# Patient Record
Sex: Male | Born: 1970 | Race: White | Hispanic: No | Marital: Married | Smoking: Current some day smoker
Health system: Southern US, Community
[De-identification: ages and names within clinical notes are randomized; demographics above are authoritative.]

## PROBLEM LIST (undated history)

## (undated) DIAGNOSIS — E119 Type 2 diabetes mellitus without complications: Secondary | ICD-10-CM

## (undated) DIAGNOSIS — I10 Essential (primary) hypertension: Secondary | ICD-10-CM

## (undated) HISTORY — PX: TOTAL HIP ARTHROPLASTY: SHX124

## (undated) HISTORY — PX: BACK SURGERY: SHX140

---

## 2002-08-17 ENCOUNTER — Emergency Department (HOSPITAL_COMMUNITY): Admission: EM | Admit: 2002-08-17 | Discharge: 2002-08-17 | Payer: Self-pay | Admitting: Emergency Medicine

## 2003-10-14 ENCOUNTER — Ambulatory Visit: Payer: Self-pay | Admitting: Gastroenterology

## 2009-01-02 ENCOUNTER — Ambulatory Visit: Payer: Self-pay | Admitting: Unknown Physician Specialty

## 2016-09-14 ENCOUNTER — Emergency Department
Admission: EM | Admit: 2016-09-14 | Discharge: 2016-09-14 | Disposition: A | Discharge status: Home / Self Care | Payer: BLUE CROSS/BLUE SHIELD | Attending: Emergency Medicine | Admitting: Emergency Medicine

## 2016-09-14 ENCOUNTER — Encounter: Payer: Self-pay | Admitting: Emergency Medicine

## 2016-09-14 ENCOUNTER — Emergency Department: Payer: BLUE CROSS/BLUE SHIELD

## 2016-09-14 DIAGNOSIS — M79644 Pain in right finger(s): Secondary | ICD-10-CM | POA: Diagnosis present

## 2016-09-14 DIAGNOSIS — L03011 Cellulitis of right finger: Secondary | ICD-10-CM | POA: Diagnosis not present

## 2016-09-14 DIAGNOSIS — I891 Lymphangitis: Secondary | ICD-10-CM

## 2016-09-14 LAB — COMPREHENSIVE METABOLIC PANEL
ALK PHOS: 86 U/L (ref 38–126)
ALT: 48 U/L (ref 17–63)
AST: 49 U/L — AB (ref 15–41)
Albumin: 4.2 g/dL (ref 3.5–5.0)
Anion gap: 10 (ref 5–15)
BILIRUBIN TOTAL: 0.9 mg/dL (ref 0.3–1.2)
BUN: 9 mg/dL (ref 6–20)
CALCIUM: 9.1 mg/dL (ref 8.9–10.3)
CO2: 24 mmol/L (ref 22–32)
Chloride: 102 mmol/L (ref 101–111)
Creatinine, Ser: 0.92 mg/dL (ref 0.61–1.24)
GFR calc Af Amer: >60 mL/min (ref >60)
Glucose, Bld: 170 mg/dL — ABNORMAL HIGH (ref 65–99)
Potassium: 3.6 mmol/L (ref 3.5–5.1)
Sodium: 136 mmol/L (ref 135–145)
TOTAL PROTEIN: 7.7 g/dL (ref 6.5–8.1)

## 2016-09-14 LAB — CBC WITH DIFFERENTIAL/PLATELET
BASOS ABS: 0.1 10*3/uL (ref 0–0.1)
BASOS PCT: 1 %
Eosinophils Absolute: 0.2 10*3/uL (ref 0–0.7)
Eosinophils Relative: 2 %
HEMATOCRIT: 41.7 % (ref 40.0–52.0)
HEMOGLOBIN: 14.6 g/dL (ref 13.0–18.0)
LYMPHS PCT: 17 %
Lymphs Abs: 2.4 10*3/uL (ref 1.0–3.6)
MCH: 33.2 pg (ref 26.0–34.0)
MCHC: 35.1 g/dL (ref 32.0–36.0)
MCV: 94.7 fL (ref 80.0–100.0)
MONOS PCT: 10 %
Monocytes Absolute: 1.3 10*3/uL — ABNORMAL HIGH (ref 0.2–1.0)
NEUTROS ABS: 9.9 10*3/uL — AB (ref 1.4–6.5)
NEUTROS PCT: 70 %
Platelets: 286 10*3/uL (ref 150–440)
RBC: 4.4 MIL/uL (ref 4.40–5.90)
RDW: 13.2 % (ref 11.5–14.5)
WBC: 13.8 10*3/uL — ABNORMAL HIGH (ref 3.8–10.6)

## 2016-09-14 MED ORDER — MUPIROCIN 2 % EX OINT: TOPICAL_OINTMENT | CUTANEOUS | 0 refills | Status: AC

## 2016-09-14 MED ORDER — HYDROMORPHONE HCL 1 MG/ML IJ SOLN
INTRAMUSCULAR | Status: AC
Start: 2016-09-14 — End: 2016-09-14
  Administered 2016-09-14: 1 mg via INTRAVENOUS
  Filled 2016-09-14: qty 1

## 2016-09-14 MED ORDER — HYDROMORPHONE HCL 1 MG/ML IJ SOLN
1.0000 mg | Freq: Once | INTRAMUSCULAR | Status: AC
  Administered 2016-09-14: 1 mg via INTRAVENOUS

## 2016-09-14 MED ORDER — ONDANSETRON HCL 4 MG/2ML IJ SOLN
INTRAMUSCULAR | Status: AC
  Administered 2016-09-14: 4 mg via INTRAVENOUS
  Filled 2016-09-14: qty 2

## 2016-09-14 MED ORDER — MORPHINE SULFATE (PF) 4 MG/ML IV SOLN
INTRAVENOUS | Status: AC
Start: 2016-09-14 — End: 2016-09-14
  Administered 2016-09-14: 4 mg via INTRAVENOUS
  Filled 2016-09-14: qty 1

## 2016-09-14 MED ORDER — MORPHINE SULFATE (PF) 4 MG/ML IV SOLN
4.0000 mg | Freq: Once | INTRAVENOUS | Status: AC
  Administered 2016-09-14: 4 mg via INTRAVENOUS

## 2016-09-14 MED ORDER — OXYCODONE-ACETAMINOPHEN 7.5-325 MG PO TABS: 1.0000 | ORAL_TABLET | ORAL | 0 refills | Status: AC | PRN

## 2016-09-14 MED ORDER — CLINDAMYCIN HCL 300 MG PO CAPS: 300.0000 mg | ORAL_CAPSULE | Freq: Three times a day (TID) | ORAL | 0 refills | Status: DC

## 2016-09-14 MED ORDER — ONDANSETRON HCL 4 MG/2ML IJ SOLN
4.0000 mg | Freq: Once | INTRAMUSCULAR | Status: AC
  Administered 2016-09-14: 4 mg via INTRAVENOUS

## 2016-09-14 MED ORDER — VANCOMYCIN HCL IN DEXTROSE 1-5 GM/200ML-% IV SOLN
1000.0000 mg | Freq: Once | INTRAVENOUS | Status: AC
  Administered 2016-09-14: 1000 mg via INTRAVENOUS
  Filled 2016-09-14: qty 200

## 2016-09-14 NOTE — ED Triage Notes (Signed)
Pt reports started with swelling and pain to right hand index finger yesterday. Pt denies injury. Swelling and redness noted.

## 2016-09-14 NOTE — ED Triage Notes (Signed)
Pt also reports that this am he had a syncopal episode as well.

## 2016-09-14 NOTE — ED Notes (Signed)
Patient states, his father reminded him that on Sunday he had stated he felt that he had a "sliver" in his finger he couldn't get out.

## 2016-09-14 NOTE — ED Notes (Addendum)
Patient presents to the ED with severe pain, swelling and discoloration of his forefinger on his right hand.  Patient reports pain and swelling began yesterday.  Patient states, "at 2am the pain was so bad I passed out."  Patient denies any trauma to his finger.  There is a black area of swelling, approximately the size of a dime on patient's forefinger.

## 2016-09-14 NOTE — ED Provider Notes (Addendum)
[facility] Emergency Department Provider Note       Time seen: ----------------------------------------- 8:05 AM on 09/14/2016 -----------------------------------------     I have reviewed the triage vital signs and the nursing notes.   HISTORY   Chief Complaint Finger Injury and Wound Infection    HPI Jesse Skinner is a 46 y.o. male who presents to the ED for pain and swelling to the right index finger since yesterday. Patient denies any known injury, works [employer] and does admit to biting his fingernails occasionally. Patient has had swelling and redness noted that extends up the right arm. He may have had a syncopal event due to the pain this morning he thinks.   History reviewed. No pertinent past medical history.  There are no active problems to display for this patient.   History reviewed. No pertinent surgical history.  Allergies Patient has no known allergies.  Social History Social History  Substance Use Topics  . Smoking status: Not on file  . Smokeless tobacco: Not on file  . Alcohol use Not on file    Review of Systems Constitutional: Negative for fever.positive for chills and sweats Cardiovascular: Negative for chest pain. Respiratory: Negative for shortness of breath. Gastrointestinal: Negative for abdominal pain, positive for nausea Musculoskeletal: positive for right index finger and hand pain Skin:positive for diaphoresis Neurological: Negative for headaches, focal weakness or numbness.  All systems negative/normal/unremarkable except as stated in the HPI  ____________________________________________   PHYSICAL EXAM:  VITAL SIGNS: ED Triage Vitals  Enc Vitals Group     BP 09/14/16 0734 (!) 176/111     Pulse Rate 09/14/16 0734 (!) 123     Resp 09/14/16 0734 20     Temp 09/14/16 0734 98.1 F (36.7 C)     Temp Source 09/14/16 0734 Oral     SpO2 09/14/16 0734 97 %     Weight 09/14/16 0730 210 lb (95.3  kg)     Height 09/14/16 0730  (1.727 m)     Head Circumference --      Peak Flow --      Pain Score 09/14/16 0729 9     Pain Loc --      Pain Edu? --      Excl. in GC? --     Constitutional: Alert and oriented. mild distress Eyes: Conjunctivae are normal. Normal extraocular movements. Cardiovascular: Normal rate, regular rhythm. No murmurs, rubs, or gallops. Respiratory: Normal respiratory effort without tachypnea nor retractions. Breath sounds are clear and equal bilaterally. No wheezes/rales/rhonchi. Gastrointestinal: Soft and nontender. Normal bowel sounds Musculoskeletal: tenderness along the right index finger particularly over the dorsal aspect. There is no fusiform swelling, there is some swelling over the distal fingertip but there is extensive swelling over the finger medially in anatomic position. Distally there is a bulla formation. No adenopathy is noted Neurologic:  Normal speech and language. No gross focal neurologic deficits are appreciated.  Skin:  erythema with blister formation over the right index finger medially and distally.there is a serpiginous erythema that extends from the right index finger  to the mid to upper arm. Psychiatric: Mood and affect are normal. Speech and behavior are normal.  ___________________________________________  ED COURSE:  Pertinent labs & imaging results that were available during my care of the patient were reviewed by me and considered in my medical decision making (see chart for details). Patient presents for fingertip infection with lymphangitis, we will assess with labs and imaging as indicated. We will  drain the bulla and send it for culture. He will receive IV vancomycin.   Marland Kitchen..Incision and Drainage Date/Time: 09/14/2016 9:46 AM Performed by: Emily FilbertWILLIAMS, Lawana Hartzell E Authorized by: Daryel NovemberWILLIAMS, Jordynn Perrier E   Consent:    Consent obtained:  Verbal Location:    Type:  Fluid collection   Location:  Upper extremity   Upper extremity  location:  Finger   Finger location:  R index finger Pre-procedure details:    Skin preparation:  Antiseptic wash and Betadine Anesthesia (see MAR for exact dosages):    Anesthesia method:  Local infiltration   Local anesthetic:  Lidocaine 1% WITH epi Procedure type:    Complexity:  Complex Procedure details:    Incision types:  Stab incision   Incision depth:  Subcutaneous   Scalpel blade:  11   Wound management:  Probed and deloculated   Drainage:  Bloody and purulent   Drainage amount:  Moderate   Wound treatment:  Wound left open   Packing materials:  None Post-procedure details:    Patient tolerance of procedure:  Tolerated well, no immediate complications Comments:     patient underwent incision and drainage of a paronychia on his right index finger and tolerated this well.   ____________________________________________   LABS (pertinent positives/negatives)  Labs Reviewed  CBC WITH DIFFERENTIAL/PLATELET - Abnormal; Notable for the following:       Result Value   WBC 13.8 (*)    Neutro Abs 9.9 (*)    Monocytes Absolute 1.3 (*)    All other components within normal limits  COMPREHENSIVE METABOLIC PANEL - Abnormal; Notable for the following:    Glucose, Bld 170 (*)    AST 49 (*)    All other components within normal limits  AEROBIC/ANAEROBIC CULTURE (SURGICAL/DEEP WOUND)    RADIOLOGY Images were viewed by me  right index finger x-ray IMPRESSION: No acute bony abnormality.  Focal soft tissue swelling at the distal second digit as above. ____________________________________________  FINAL ASSESSMENT AND PLAN  paronychia, cellulitis  Plan: Patient's labs and imaging were dictated above. Patient had presented for right index finger pain and swelling with some signs of lymphangitis. He did undergo incision and drainage successfully eating continues to have drainage from the right index finger. We did give IV vancomycin and he'll continue home with oral  clindamycin as well as Bactroban and pain medicine. I did offer admission for pain control and continued IV antibiotics but he has declined and would prefer to go home. He is advised to follow-up in 1-2 days for wound recheck.   Emily FilbertWilliams, Keola Heninger E, MD   Note: This note was generated in part or whole with voice recognition software. Voice recognition is usually quite accurate but there are transcription errors that can and very often do occur. I apologize for any typographical errors that were not detected and corrected.     Emily FilbertWilliams, Charrise Lardner E, MD 09/14/16 [id number]    Emily FilbertWilliams, Evadne Ose E, MD 09/14/16 1058

## 2016-09-19 LAB — AEROBIC/ANAEROBIC CULTURE W GRAM STAIN (SURGICAL/DEEP WOUND): Special Requests: NORMAL

## 2017-04-18 ENCOUNTER — Emergency Department: Payer: BLUE CROSS/BLUE SHIELD

## 2017-04-18 ENCOUNTER — Encounter: Payer: Self-pay | Admitting: Emergency Medicine

## 2017-04-18 ENCOUNTER — Inpatient Hospital Stay
Admission: EM | Admit: 2017-04-18 | Discharge: 2017-04-22 | DRG: 854 | Disposition: A | Discharge status: Home / Self Care | Payer: BLUE CROSS/BLUE SHIELD | Attending: Internal Medicine | Admitting: Internal Medicine

## 2017-04-18 ENCOUNTER — Other Ambulatory Visit: Payer: Self-pay

## 2017-04-18 DIAGNOSIS — Z7982 Long term (current) use of aspirin: Secondary | ICD-10-CM | POA: Diagnosis not present

## 2017-04-18 DIAGNOSIS — L03119 Cellulitis of unspecified part of limb: Secondary | ICD-10-CM | POA: Diagnosis present

## 2017-04-18 DIAGNOSIS — F1010 Alcohol abuse, uncomplicated: Secondary | ICD-10-CM | POA: Diagnosis present

## 2017-04-18 DIAGNOSIS — Z7984 Long term (current) use of oral hypoglycemic drugs: Secondary | ICD-10-CM

## 2017-04-18 DIAGNOSIS — E669 Obesity, unspecified: Secondary | ICD-10-CM | POA: Diagnosis present

## 2017-04-18 DIAGNOSIS — B9562 Methicillin resistant Staphylococcus aureus infection as the cause of diseases classified elsewhere: Secondary | ICD-10-CM | POA: Diagnosis present

## 2017-04-18 DIAGNOSIS — L02411 Cutaneous abscess of right axilla: Secondary | ICD-10-CM | POA: Diagnosis present

## 2017-04-18 DIAGNOSIS — L02413 Cutaneous abscess of right upper limb: Secondary | ICD-10-CM | POA: Diagnosis present

## 2017-04-18 DIAGNOSIS — E119 Type 2 diabetes mellitus without complications: Secondary | ICD-10-CM | POA: Diagnosis present

## 2017-04-18 DIAGNOSIS — L0390 Cellulitis, unspecified: Secondary | ICD-10-CM

## 2017-04-18 DIAGNOSIS — Z79899 Other long term (current) drug therapy: Secondary | ICD-10-CM

## 2017-04-18 DIAGNOSIS — R590 Localized enlarged lymph nodes: Secondary | ICD-10-CM

## 2017-04-18 DIAGNOSIS — F17200 Nicotine dependence, unspecified, uncomplicated: Secondary | ICD-10-CM | POA: Diagnosis present

## 2017-04-18 DIAGNOSIS — A419 Sepsis, unspecified organism: Principal | ICD-10-CM | POA: Diagnosis present

## 2017-04-18 DIAGNOSIS — L03113 Cellulitis of right upper limb: Secondary | ICD-10-CM

## 2017-04-18 DIAGNOSIS — Z96642 Presence of left artificial hip joint: Secondary | ICD-10-CM | POA: Diagnosis present

## 2017-04-18 DIAGNOSIS — Z6834 Body mass index (BMI) 34.0-34.9, adult: Secondary | ICD-10-CM

## 2017-04-18 DIAGNOSIS — L03111 Cellulitis of right axilla: Secondary | ICD-10-CM | POA: Diagnosis present

## 2017-04-18 DIAGNOSIS — I10 Essential (primary) hypertension: Secondary | ICD-10-CM | POA: Diagnosis present

## 2017-04-18 HISTORY — DX: Type 2 diabetes mellitus without complications: E11.9

## 2017-04-18 HISTORY — DX: Essential (primary) hypertension: I10

## 2017-04-18 LAB — COMPREHENSIVE METABOLIC PANEL
ALBUMIN: 3.7 g/dL (ref 3.5–5.0)
ALT: 28 U/L (ref 17–63)
ANION GAP: 10 (ref 5–15)
AST: 32 U/L (ref 15–41)
Alkaline Phosphatase: 88 U/L (ref 38–126)
BILIRUBIN TOTAL: 0.9 mg/dL (ref 0.3–1.2)
BUN: 8 mg/dL (ref 6–20)
CO2: 23 mmol/L (ref 22–32)
Calcium: 9.2 mg/dL (ref 8.9–10.3)
Chloride: 95 mmol/L — ABNORMAL LOW (ref 101–111)
Creatinine, Ser: 0.73 mg/dL (ref 0.61–1.24)
GFR calc Af Amer: >60 mL/min (ref >60)
GFR calc non Af Amer: >60 mL/min (ref >60)
GLUCOSE: 191 mg/dL — AB (ref 65–99)
POTASSIUM: 3.9 mmol/L (ref 3.5–5.1)
Sodium: 128 mmol/L — ABNORMAL LOW (ref 135–145)
TOTAL PROTEIN: 8.2 g/dL — AB (ref 6.5–8.1)

## 2017-04-18 LAB — CBC WITH DIFFERENTIAL/PLATELET
BASOS PCT: 0 %
Basophils Absolute: 0.1 10*3/uL (ref 0–0.1)
EOS ABS: 0.1 10*3/uL (ref 0–0.7)
Eosinophils Relative: 1 %
HCT: 42 % (ref 40.0–52.0)
HEMOGLOBIN: 14.6 g/dL (ref 13.0–18.0)
LYMPHS ABS: 2.5 10*3/uL (ref 1.0–3.6)
Lymphocytes Relative: 16 %
MCH: 33.2 pg (ref 26.0–34.0)
MCHC: 34.8 g/dL (ref 32.0–36.0)
MCV: 95.5 fL (ref 80.0–100.0)
Monocytes Absolute: 1.6 10*3/uL — ABNORMAL HIGH (ref 0.2–1.0)
Monocytes Relative: 10 %
NEUTROS PCT: 73 %
Neutro Abs: 11.6 10*3/uL — ABNORMAL HIGH (ref 1.4–6.5)
Platelets: 336 10*3/uL (ref 150–440)
RBC: 4.39 MIL/uL — AB (ref 4.40–5.90)
RDW: 11.9 % (ref 11.5–14.5)
WBC: 15.9 10*3/uL — AB (ref 3.8–10.6)

## 2017-04-18 LAB — URINALYSIS, COMPLETE (UACMP) WITH MICROSCOPIC
Bacteria, UA: NONE SEEN
Bilirubin Urine: NEGATIVE
Ketones, ur: NEGATIVE mg/dL
Leukocytes, UA: NEGATIVE
Nitrite: NEGATIVE
PH: 6 (ref 5.0–8.0)
PROTEIN: NEGATIVE mg/dL
SPECIFIC GRAVITY, URINE: 1.018 (ref 1.005–1.030)
Squamous Epithelial / LPF: NONE SEEN

## 2017-04-18 LAB — LACTIC ACID, PLASMA
LACTIC ACID, VENOUS: 2.1 mmol/L — AB (ref 0.5–1.9)
Lactic Acid, Venous: 0.7 mmol/L (ref 0.5–1.9)

## 2017-04-18 LAB — GLUCOSE, CAPILLARY
Glucose-Capillary: 116 mg/dL — ABNORMAL HIGH (ref 65–99)
Glucose-Capillary: 173 mg/dL — ABNORMAL HIGH (ref 65–99)

## 2017-04-18 MED ORDER — ACETAMINOPHEN 650 MG RE SUPP: 650.0000 mg | Freq: Four times a day (QID) | RECTAL | Status: DC | PRN

## 2017-04-18 MED ORDER — ONDANSETRON HCL 4 MG/2ML IJ SOLN: 4.0000 mg | Freq: Four times a day (QID) | INTRAMUSCULAR | Status: DC | PRN

## 2017-04-18 MED ORDER — VITAMIN B-1 100 MG PO TABS
100.0000 mg | ORAL_TABLET | Freq: Every day | ORAL | Status: DC
  Administered 2017-04-18 – 2017-04-22 (×5): 100 mg via ORAL
  Filled 2017-04-18 (×5): qty 1

## 2017-04-18 MED ORDER — VANCOMYCIN HCL IN DEXTROSE 1-5 GM/200ML-% IV SOLN
1000.0000 mg | Freq: Once | INTRAVENOUS | Status: AC
  Administered 2017-04-18: 1000 mg via INTRAVENOUS
  Filled 2017-04-18: qty 200

## 2017-04-18 MED ORDER — ENOXAPARIN SODIUM 40 MG/0.4ML ~~LOC~~ SOLN
40.0000 mg | SUBCUTANEOUS | Status: DC
  Administered 2017-04-18 – 2017-04-21 (×4): 40 mg via SUBCUTANEOUS
  Filled 2017-04-18 (×4): qty 0.4

## 2017-04-18 MED ORDER — HYDROMORPHONE HCL 1 MG/ML IJ SOLN
1.0000 mg | Freq: Once | INTRAMUSCULAR | Status: AC
  Administered 2017-04-18: 1 mg via INTRAVENOUS
  Filled 2017-04-18: qty 1

## 2017-04-18 MED ORDER — METOPROLOL SUCCINATE ER 50 MG PO TB24
50.0000 mg | ORAL_TABLET | Freq: Every day | ORAL | Status: DC
  Administered 2017-04-18 – 2017-04-22 (×5): 50 mg via ORAL
  Filled 2017-04-18 (×5): qty 1

## 2017-04-18 MED ORDER — ACETAMINOPHEN 325 MG PO TABS: 650.0000 mg | ORAL_TABLET | Freq: Four times a day (QID) | ORAL | Status: DC | PRN

## 2017-04-18 MED ORDER — HYDROCODONE-ACETAMINOPHEN 5-325 MG PO TABS
ORAL_TABLET | ORAL | Status: AC
  Filled 2017-04-18: qty 2

## 2017-04-18 MED ORDER — GABAPENTIN 400 MG PO CAPS
800.0000 mg | ORAL_CAPSULE | Freq: Three times a day (TID) | ORAL | Status: DC
  Administered 2017-04-18 – 2017-04-22 (×11): 800 mg via ORAL
  Filled 2017-04-18 (×11): qty 2

## 2017-04-18 MED ORDER — BISACODYL 10 MG RE SUPP
10.0000 mg | Freq: Every day | RECTAL | Status: DC | PRN
  Administered 2017-04-20: 10 mg via RECTAL
  Filled 2017-04-18 (×2): qty 1

## 2017-04-18 MED ORDER — HYDROCODONE-ACETAMINOPHEN 5-325 MG PO TABS
1.0000 | ORAL_TABLET | ORAL | Status: DC | PRN
  Administered 2017-04-18 – 2017-04-21 (×12): 2 via ORAL
  Administered 2017-04-21: 1 via ORAL
  Administered 2017-04-21 – 2017-04-22 (×4): 2 via ORAL
  Filled 2017-04-18: qty 2
  Filled 2017-04-18: qty 1
  Filled 2017-04-18 (×14): qty 2

## 2017-04-18 MED ORDER — INSULIN ASPART 100 UNIT/ML ~~LOC~~ SOLN: 0.0000 [IU] | Freq: Every day | SUBCUTANEOUS | Status: DC

## 2017-04-18 MED ORDER — SODIUM CHLORIDE 0.9 % IV BOLUS (SEPSIS)
1000.0000 mL | Freq: Once | INTRAVENOUS | Status: AC
  Administered 2017-04-18: 1000 mL via INTRAVENOUS

## 2017-04-18 MED ORDER — ONDANSETRON HCL 4 MG PO TABS: 4.0000 mg | ORAL_TABLET | Freq: Four times a day (QID) | ORAL | Status: DC | PRN

## 2017-04-18 MED ORDER — POTASSIUM CHLORIDE IN NACL 20-0.9 MEQ/L-% IV SOLN
INTRAVENOUS | Status: DC
  Administered 2017-04-18 – 2017-04-19 (×2): via INTRAVENOUS
  Filled 2017-04-18 (×5): qty 1000

## 2017-04-18 MED ORDER — SODIUM CHLORIDE 0.9 % IV BOLUS
1000.0000 mL | Freq: Once | INTRAVENOUS | Status: AC
Start: 2017-04-18 — End: 2017-04-18
  Administered 2017-04-18: 1000 mL via INTRAVENOUS

## 2017-04-18 MED ORDER — MORPHINE SULFATE (PF) 2 MG/ML IV SOLN
2.0000 mg | INTRAVENOUS | Status: DC | PRN
  Administered 2017-04-18 – 2017-04-22 (×19): 2 mg via INTRAVENOUS
  Filled 2017-04-18 (×19): qty 1

## 2017-04-18 MED ORDER — PANTOPRAZOLE SODIUM 40 MG PO TBEC
40.0000 mg | DELAYED_RELEASE_TABLET | Freq: Every day | ORAL | Status: DC
  Administered 2017-04-19 – 2017-04-22 (×4): 40 mg via ORAL
  Filled 2017-04-18 (×4): qty 1

## 2017-04-18 MED ORDER — FOLIC ACID 1 MG PO TABS
1.0000 mg | ORAL_TABLET | Freq: Every day | ORAL | Status: DC
  Administered 2017-04-18 – 2017-04-22 (×5): 1 mg via ORAL
  Filled 2017-04-18 (×5): qty 1

## 2017-04-18 MED ORDER — SODIUM CHLORIDE 0.9 % IV SOLN
1250.0000 mg | Freq: Three times a day (TID) | INTRAVENOUS | Status: DC
  Administered 2017-04-18 – 2017-04-22 (×10): 1250 mg via INTRAVENOUS
  Filled 2017-04-18 (×15): qty 1250

## 2017-04-18 MED ORDER — ASPIRIN EC 81 MG PO TBEC
81.0000 mg | DELAYED_RELEASE_TABLET | Freq: Every day | ORAL | Status: DC
  Administered 2017-04-19 – 2017-04-22 (×4): 81 mg via ORAL
  Filled 2017-04-18 (×4): qty 1

## 2017-04-18 MED ORDER — INSULIN ASPART 100 UNIT/ML ~~LOC~~ SOLN
0.0000 [IU] | Freq: Three times a day (TID) | SUBCUTANEOUS | Status: DC
  Administered 2017-04-19: 2 [IU] via SUBCUTANEOUS
  Administered 2017-04-19: 1 [IU] via SUBCUTANEOUS
  Administered 2017-04-19: 2 [IU] via SUBCUTANEOUS
  Administered 2017-04-20: 1 [IU] via SUBCUTANEOUS
  Administered 2017-04-21 (×2): 2 [IU] via SUBCUTANEOUS
  Administered 2017-04-21: 1 [IU] via SUBCUTANEOUS
  Administered 2017-04-22: 2 [IU] via SUBCUTANEOUS
  Filled 2017-04-18 (×8): qty 1

## 2017-04-18 MED ORDER — PIPERACILLIN-TAZOBACTAM 3.375 G IVPB 30 MIN
3.3750 g | Freq: Once | INTRAVENOUS | Status: AC
  Administered 2017-04-18: 3.375 g via INTRAVENOUS
  Filled 2017-04-18: qty 50

## 2017-04-18 MED ORDER — POLYETHYLENE GLYCOL 3350 17 G PO PACK
17.0000 g | PACK | Freq: Every day | ORAL | Status: DC | PRN
  Administered 2017-04-19 – 2017-04-21 (×3): 17 g via ORAL
  Filled 2017-04-18 (×3): qty 1

## 2017-04-18 MED ORDER — LORAZEPAM 1 MG PO TABS: 1.0000 mg | ORAL_TABLET | Freq: Four times a day (QID) | ORAL | Status: AC | PRN

## 2017-04-18 MED ORDER — SODIUM CHLORIDE 0.9 % IV BOLUS
500.0000 mL | Freq: Once | INTRAVENOUS | Status: AC
  Administered 2017-04-18: 500 mL via INTRAVENOUS

## 2017-04-18 MED ORDER — LORAZEPAM 2 MG/ML IJ SOLN: 1.0000 mg | Freq: Four times a day (QID) | INTRAMUSCULAR | Status: AC | PRN

## 2017-04-18 MED ORDER — SIMVASTATIN 40 MG PO TABS
40.0000 mg | ORAL_TABLET | Freq: Every day | ORAL | Status: DC
  Administered 2017-04-19 – 2017-04-21 (×3): 40 mg via ORAL
  Filled 2017-04-18 (×5): qty 1

## 2017-04-18 MED ORDER — SODIUM CHLORIDE 0.9 % IV SOLN
3.0000 g | Freq: Four times a day (QID) | INTRAVENOUS | Status: DC
  Administered 2017-04-18 – 2017-04-20 (×6): 3 g via INTRAVENOUS
  Filled 2017-04-18 (×8): qty 3

## 2017-04-18 MED ORDER — THIAMINE HCL 100 MG/ML IJ SOLN: 100.0000 mg | Freq: Every day | INTRAMUSCULAR | Status: DC

## 2017-04-18 MED ORDER — ADULT MULTIVITAMIN W/MINERALS CH
1.0000 | ORAL_TABLET | Freq: Every day | ORAL | Status: DC
  Administered 2017-04-18 – 2017-04-22 (×5): 1 via ORAL
  Filled 2017-04-18 (×5): qty 1

## 2017-04-18 MED ORDER — LISINOPRIL 20 MG PO TABS
20.0000 mg | ORAL_TABLET | Freq: Every day | ORAL | Status: DC
  Administered 2017-04-18 – 2017-04-22 (×5): 20 mg via ORAL
  Filled 2017-04-18 (×5): qty 1

## 2017-04-18 NOTE — Progress Notes (Signed)
CODE SEPSIS - PHARMACY COMMUNICATION  **Broad Spectrum Antibiotics should be administered within 1 hour of Sepsis diagnosis**  Time Code Sepsis Called/Page Received: 16:03  Antibiotics Ordered: vanc/Zosyn  Time of 1st antibiotic administration: 16:28  Additional action taken by pharmacy:   If necessary, Name of Provider/Nurse Contacted:     Carola FrostNathan A Briane Birden ,PharmD Clinical Pharmacist  04/18/2017  4:03 PM

## 2017-04-18 NOTE — ED Provider Notes (Addendum)
[facility] Emergency Department Provider Note  ____________________________________________   I have reviewed the triage vital signs and the nursing notes. Where available I have reviewed prior notes and, if possible and indicated, outside hospital notes.    HISTORY  Chief Complaint Abscess    HPI Jesse Skinner is a 47 y.o. male spontaneously draining abscess in his right axilla since Wednesday.  Patient states that his sugars have been somewhat up, he has been having fevers at home he feels unwell.  Subjective fevers has not taken his temperature.  He denies IV drug abuse.  He does admit to drinking 4-5 beers every day.  Does not admit to history of withdrawal.  Patient states that there is been pus draining out of the area and is not markedly indurated but now that there is swelling going down his arm he needed to be checked out.  Symptoms since last Wednesday, nothing makes it better nothing makes it worse no other alleviating or aggravating factors.  No Prior treatment it is very painful.   Past Medical History:  Diagnosis Date  . Diabetes mellitus without complication (HCC)   . Hypertension     There are no active problems to display for this patient.   Past Surgical History:  Procedure Laterality Date  . BACK SURGERY    . TOTAL HIP ARTHROPLASTY Left     Prior to Admission medications   Medication Sig Start Date End Date Taking? Authorizing Provider  amoxicillin (AMOXIL) 500 MG capsule Take 500 mg by mouth.   Yes [provider]  aspirin EC 81 MG tablet Take 81 mg by mouth daily.   Yes [provider]  gabapentin (NEURONTIN) 400 MG capsule Take 800 mg by mouth 3 (three) times daily.   Yes [provider]  glyBURIDE-metformin (GLUCOVANCE) 2.5-500 MG tablet Take 2 tablets by mouth 2 (two) times daily with a meal.  09/06/16   [provider]  lisinopril-hydrochlorothiazide (PRINZIDE,ZESTORETIC) 20-12.5 MG tablet  Take 1 tablet by mouth daily.    [provider]  meloxicam (MOBIC) 7.5 MG tablet Take 7.5 mg by mouth daily as needed for pain.    [provider]  metoprolol succinate (TOPROL-XL) 50 MG 24 hr tablet Take 50 mg by mouth daily. 09/06/16   [provider]  Multiple Vitamins-Minerals (CENTRUM ADULTS PO) Take 1 tablet by mouth daily.    [provider]  mupirocin ointment (BACTROBAN) 2 % Apply to affected area 3 times daily 09/14/16 09/14/17  Emily Filbert, MD  omeprazole (PRILOSEC) 40 MG capsule Take 40 mg by mouth daily. 09/06/16   [provider]  oxyCODONE-acetaminophen (PERCOCET) 7.5-325 MG tablet Take 1 tablet by mouth every 4 (four) hours as needed for severe pain. Patient not taking: Reported on 04/18/2017 09/14/16 09/14/17  Emily Filbert, MD  simvastatin (ZOCOR) 40 MG tablet Take 40 mg by mouth daily. 09/06/16   [provider]    Allergies Patient has no known allergies.  No family history on file.  Social History Social History   Tobacco Use  . Smoking status: Current Some Day Smoker  . Smokeless tobacco: Never Used  Substance Use Topics  . Alcohol use: Yes  . Drug use: Not on file    Review of Systems Constitutional: + fever Eyes: No visual changes. ENT: No sore throat. No stiff neck no neck pain Cardiovascular: Denies chest pain. Respiratory: Denies shortness of breath. Gastrointestinal:   no vomiting.  No diarrhea.  No constipation.  Genitourinary: Negative for dysuria. Musculoskeletal: Negative lower extremity swelling Skin: + for rash. Neurological: Negative for severe headaches, focal weakness or numbness.   ____________________________________________   PHYSICAL EXAM:  VITAL SIGNS: ED Triage Vitals  Enc Vitals Group     BP 04/18/17 1441 (!) 124/95     Pulse Rate 04/18/17 1441 (!) 133     Resp 04/18/17 1441 18     Temp 04/18/17 1441 99 F (37.2 C)     Temp Source 04/18/17 1441 Oral     SpO2  04/18/17 1441 98 %     Weight 04/18/17 1442 215 lb (97.5 kg)     Height 04/18/17 1442 5\' 7"  (1.702 m)     Head Circumference --      Peak Flow --      Pain Score 04/18/17 1442 10     Pain Loc --      Pain Edu? --      Excl. in GC? --     Constitutional: Alert and oriented. Well appearing and in no acute distress.  There is uncomfortable but nontoxic Eyes: Conjunctivae are normal Head: Atraumatic HEENT: No congestion/rhinnorhea. Mucous membranes are moist.  Oropharynx non-erythematous Neck:   Nontender with no meningismus, no masses, no stridor Cardiovascular: Normal rate, regular rhythm. Grossly normal heart sounds.  Good peripheral circulation. Respiratory: Normal respiratory effort.  No retractions. Lungs CTAB. Abdominal: Soft and nontender. No distention. No guarding no rebound Back:  There is no focal tenderness or step off.  there is no midline tenderness there are no lesions noted. there is no CVA tenderness Musculoskeletal: No lower extremity tenderness, no upper extremity tenderness. No joint effusions, no DVT signs strong distal pulses no edema Neurologic:  Normal speech and language. No gross focal neurologic deficits are appreciated.  Skin:  Skin is warm, dry and intact.  Cellulitic changes to his axilla going down past his elbow on the right.  There is some mild induration and drainage from the axilla but no fluctuance or obvious abscess noted.  It is tender to touch. Psychiatric: Mood and affect are normal. Speech and behavior are normal.  ____________________________________________   LABS (all labs ordered are listed, but only abnormal results are displayed)  Labs Reviewed  COMPREHENSIVE METABOLIC PANEL - Abnormal; Notable for the following components:      Result Value   Sodium 128 (*)    Chloride 95 (*)    Glucose, Bld 191 (*)    Total Protein 8.2 (*)    All other components within normal limits  LACTIC ACID, PLASMA - Abnormal; Notable for the following  components:   Lactic Acid, Venous 2.1 (*)    All other components within normal limits  CBC WITH DIFFERENTIAL/PLATELET - Abnormal; Notable for the following components:   WBC 15.9 (*)    RBC 4.39 (*)    Neutro Abs 11.6 (*)    Monocytes Absolute 1.6 (*)    All other components within normal limits  URINALYSIS, COMPLETE (UACMP) WITH MICROSCOPIC - Abnormal; Notable for the following components:   Color, Urine YELLOW (*)    APPearance CLEAR (*)    Glucose, UA >=500 (*)    Hgb urine dipstick SMALL (*)    All other components within normal limits  CULTURE, BLOOD (ROUTINE X 2)  CULTURE, BLOOD (ROUTINE X 2)  AEROBIC CULTURE (SUPERFICIAL SPECIMEN)  LACTIC ACID, PLASMA    Pertinent labs  results that were available during my care of the patient were reviewed by me and considered  in my medical decision making (see chart for details). ____________________________________________  EKG  I personally interpreted any EKGs ordered by me or triage  ____________________________________________  RADIOLOGY  Pertinent labs & imaging results that were available during my care of the patient were reviewed by me and considered in my medical decision making (see chart for details). If possible, patient and/or family made aware of any abnormal findings.  No results found. ____________________________________________    PROCEDURES  Procedure(s) performed: None  Procedures  Critical Care performed: CRITICAL CARE Performed by: Jeanmarie PlantJAMES A Kentrell Hallahan   Total critical care time: 38 minutes  Critical care time was exclusive of separately billable procedures and treating other patients.  Critical care was necessary to treat or prevent imminent or life-threatening deterioration.  Critical care was time spent personally by me on the following activities: development of treatment plan with patient and/or surrogate as well as nursing, discussions with consultants, evaluation of patient's response to  treatment, examination of patient, obtaining history from patient or surrogate, ordering and performing treatments and interventions, ordering and review of laboratory studies, ordering and review of radiographic studies, pulse oximetry and re-evaluation of patient's condition.   ____________________________________________   INITIAL IMPRESSION / ASSESSMENT AND PLAN / ED COURSE  Pertinent labs & imaging results that were available during my care of the patient were reviewed by me and considered in my medical decision making (see chart for details).  She is here with right sided draining abscess with purulent expressed, I do not see evidence of anything I need to drain clinically although it somewhat difficult given the induration from the cellulitis we will obtain an ultrasound to rule out purulent accretion that requires intervention.  We are giving him IV fluid, his lactic is somewhat up his heart rate is up I think his heart rate is mostly because of discomfort, he may however be borderline for sepsis therefore we will give him sepsis level fluids for ideal body weight, and I am getting vancomycin and Zosyn he needs to be admitted to the hospital for a significant cellulitis with systemic symptoms and diabetes etc.  ----------------------------------------- 6:30 PM on 04/18/2017 -----------------------------------------  Percent was noted, discussed with hospitalist they will follow-up, no indication at this time given that ultrasound for acute ER intervention.  Surgery apparently has been counseled by hospital service to follow.    ____________________________________________   FINAL CLINICAL IMPRESSION(S) / ED DIAGNOSES  Final diagnoses:  Cellulitis of right upper extremity  Cellulitis   Early sepsis   This chart was dictated using voice recognition software.  Despite best efforts to proofread,  errors can occur which can change meaning.      Jeanmarie PlantMcShane, Courtlyn Aki A, MD 04/18/17  1631    Jeanmarie PlantMcShane, Dealie Koelzer A, MD 04/18/17 [phone]

## 2017-04-18 NOTE — H&P (Signed)
[facility] - [place] at [facility]   PATIENT NAME: Jesse Skinner    MR#:  [id number]  DATE OF BIRTH:  03-03-1970  DATE OF ADMISSION:  04/18/2017  PRIMARY CARE PHYSICIAN: Evelene Croon, MD   REQUESTING/REFERRING PHYSICIAN: Dr. Alphonzo Lemmings  CHIEF COMPLAINT:   Chief Complaint  Patient presents with  . Abscess    HISTORY OF PRESENT ILLNESS:  Jesse Skinner  is a 47 y.o. male with a known history of DM, HTN, alcohol abuse presents with right axillary swelling and discharge. For past 5 days. He presented today cause the redness I extending down the arm now. Here he has tachycardia, leucocytosis, elevated lactic acid and sepsis. Being admitted.  PAST MEDICAL HISTORY:   Past Medical History:  Diagnosis Date  . Diabetes mellitus without complication (HCC)   . Hypertension     PAST SURGICAL HISTORY:   Past Surgical History:  Procedure Laterality Date  . BACK SURGERY    . TOTAL HIP ARTHROPLASTY Left     SOCIAL HISTORY:   Social History   Tobacco Use  . Smoking status: Current Some Day Smoker  . Smokeless tobacco: Never Used  Substance Use Topics  . Alcohol use: Yes    FAMILY HISTORY:   Family History  Problem Relation Age of Onset  . Skin cancer Neg Hx     DRUG ALLERGIES:  No Known Allergies  REVIEW OF SYSTEMS:   Review of Systems  Constitutional: Positive for chills, fever and malaise/fatigue. Negative for weight loss.  HENT: Negative for hearing loss and nosebleeds.   Eyes: Negative for blurred vision, double vision and pain.  Respiratory: Negative for cough, hemoptysis, sputum production, shortness of breath and wheezing.   Cardiovascular: Negative for chest pain, palpitations, orthopnea and leg swelling.  Gastrointestinal: Negative for abdominal pain, constipation, diarrhea, nausea and vomiting.  Genitourinary: Negative for dysuria and hematuria.  Musculoskeletal: Negative for back pain, falls and myalgias.  Skin: Negative for rash.   Neurological: Negative for dizziness, tremors, sensory change, speech change, focal weakness, seizures and headaches.  Endo/Heme/Allergies: Does not bruise/bleed easily.  Psychiatric/Behavioral: Negative for depression and memory loss. The patient is not nervous/anxious.     MEDICATIONS AT HOME:   Prior to Admission medications   Medication Sig Start Date End Date Taking? Authorizing Provider  aspirin EC 81 MG tablet Take 81 mg by mouth daily.   Yes [provider]  gabapentin (NEURONTIN) 400 MG capsule Take 800 mg by mouth 3 (three) times daily.   Yes [provider]  glyBURIDE-metformin (GLUCOVANCE) 2.5-500 MG tablet Take 2 tablets by mouth 2 (two) times daily with a meal.  09/06/16  Yes [provider]  lisinopril (PRINIVIL,ZESTRIL) 20 MG tablet Take 20 mg by mouth daily.   Yes [provider]  meloxicam (MOBIC) 7.5 MG tablet Take 7.5 mg by mouth daily as needed for pain.   Yes [provider]  metoprolol succinate (TOPROL-XL) 50 MG 24 hr tablet Take 50 mg by mouth daily. 09/06/16  Yes [provider]  Multiple Vitamins-Minerals (CENTRUM ADULTS PO) Take 1 tablet by mouth daily.   Yes [provider]  omeprazole (PRILOSEC) 40 MG capsule Take 40 mg by mouth daily. 09/06/16  Yes [provider]  simvastatin (ZOCOR) 40 MG tablet Take 40 mg by mouth daily. 09/06/16  Yes [provider]  amoxicillin (AMOXIL) 500 MG capsule Take 500 mg by mouth.    [provider]  mupirocin ointment (BACTROBAN) 2 % Apply to affected area  3 times daily Patient not taking: Reported on 04/18/2017 09/14/16 09/14/17  Emily FilbertWilliams, Jonathan E, MD  oxyCODONE-acetaminophen (PERCOCET) 7.5-325 MG tablet Take 1 tablet by mouth every 4 (four) hours as needed for severe pain. Patient not taking: Reported on 04/18/2017 09/14/16 09/14/17  Emily FilbertWilliams, Jonathan E, MD     VITAL SIGNS:  Blood pressure (!) 124/95, pulse (!) 133, temperature 99 F (37.2 C),  temperature source Oral, resp. rate 18, height 5\' 7"  (1.702 m), weight 97.5 kg (215 lb), SpO2 98 %.  PHYSICAL EXAMINATION:  Physical Exam  GENERAL:  47 y.o.-year-old patient lying in the bed with no acute distress.  EYES: Pupils equal, round, reactive to light and accommodation. No scleral icterus. Extraocular muscles intact.  HEENT: Head atraumatic, normocephalic. Oropharynx and nasopharynx clear. No oropharyngeal erythema, moist oral mucosa  NECK:  Supple, no jugular venous distention. No thyroid enlargement, no tenderness.  LUNGS: Normal breath sounds bilaterally, no wheezing, rales, rhonchi. No use of accessory muscles of respiration.  CARDIOVASCULAR: S1, S2 normal. No murmurs, rubs, or gallops.  ABDOMEN: Soft, nontender, nondistended. Bowel sounds present. No organomegaly or mass.  EXTREMITIES: No pedal edema, cyanosis, or clubbing.  Right arm redness extending from axilla medially. Purulent discharge NEUROLOGIC: Cranial nerves II through XII are intact. No focal Motor or sensory deficits appreciated b/l PSYCHIATRIC: The patient is alert and oriented x 3. Good affect.  SKIN: No obvious rash, lesion, or ulcer.   LABORATORY PANEL:   CBC Recent Labs  Lab 04/18/17 1458  WBC 15.9*  HGB 14.6  HCT 42.0  PLT 336   ------------------------------------------------------------------------------------------------------------------  Chemistries  Recent Labs  Lab 04/18/17 1458  NA 128*  K 3.9  CL 95*  CO2 23  GLUCOSE 191*  BUN 8  CREATININE 0.73  CALCIUM 9.2  AST 32  ALT 28  ALKPHOS 88  BILITOT 0.9   ------------------------------------------------------------------------------------------------------------------  Cardiac Enzymes No results for input(s): TROPONINI in the last 168 hours. ------------------------------------------------------------------------------------------------------------------  RADIOLOGY:  No results found.   IMPRESSION AND PLAN:   * Right  axillary abscess and cellulitis with Sepsis POA Spontaneously draining. IV abx. Vancomycin and unasyn. IVF Blood and wound cx Check US. Consult surgery for I n D. Discussed with Dr. Earlene Plateravis of surgery.  * DM Hold PO hypoglycemics. Insulin SSI  * HTN Continue homemedications  * Alcohol abuse CIWA protocol  * DVT prophylaxis Lovenox  All the records are reviewed and case discussed with ED provider. Management plans discussed with the patient, family and they are in agreement.  CODE STATUS: FULL CODE  TOTAL TIME TAKING CARE OF THIS PATIENT: 40 minutes.   Orie FishermanSrikar R Tome Wilson M.D on 04/18/2017 at 4:43 PM  Between 7am to 6pm - Pager - [phone]  After 6pm go to [email or web address] - password [email or web address]  [facility] [place] [facility]  Office  [phone]  CC: Primary care physician; Evelene CroonNiemeyer, Meindert, MD  Note: This dictation was prepared with Dragon dictation along with smaller phrase technology. Any transcriptional errors that result from this process are unintentional.

## 2017-04-18 NOTE — Progress Notes (Signed)
Pharmacy Antibiotic Note  Jesse Skinner is a 47 y.o. male admitted on 04/18/2017 with wound infection.  Pharmacy has been consulted for Unasyn and vancomycin dosing.  Plan: 1. Unasyn 3 gm IV Q6H 2. Vancomycin 1 gm IV x 1 in ED followed in approximately 6 hours (stacked dosing) by vancomycin 1.25 gm IV Q8H, predicted trough 17 mcg/mL, pharmacy will continue to follow and adjust as needed to maintain trough 15 to 20 mcg/mL.  Vd 55.1 L, Ke 0.109 hr-1, T1/2 6.4 hr  Height: 5\' 7"  (170.2 cm) Weight: 215 lb (97.5 kg) IBW/kg (Calculated) : 66.1  Temp (24hrs), Avg:99 F (37.2 C), Min:99 F (37.2 C), Max:99 F (37.2 C)  Recent Labs  Lab 04/18/17 1458  WBC 15.9*  CREATININE 0.73  LATICACIDVEN 2.1*    Estimated Creatinine Clearance: 125.7 mL/min (by C-G formula based on SCr of 0.73 mg/dL).    No Known Allergies  Antimicrobials this admission:   Dose adjustments this admission:   Microbiology results: 4/16 BCx: pending 4/16 WCx (axilla, right): pending  MRSA PCR:   Thank you for allowing pharmacy to be a part of this patient's care.  Carola FrostNathan A Jakita Dutkiewicz, Pharm.D., BCPS Clinical Pharmacist 04/18/2017 4:52 PM

## 2017-04-18 NOTE — ED Triage Notes (Signed)
Noticed sore in right arm pit.  It started draining last night,.  Running fever.  Red goesd own entire upper arm.

## 2017-04-19 DIAGNOSIS — L03113 Cellulitis of right upper limb: Secondary | ICD-10-CM

## 2017-04-19 DIAGNOSIS — R590 Localized enlarged lymph nodes: Secondary | ICD-10-CM

## 2017-04-19 LAB — GLUCOSE, CAPILLARY
GLUCOSE-CAPILLARY: 128 mg/dL — AB (ref 65–99)
Glucose-Capillary: 190 mg/dL — ABNORMAL HIGH (ref 65–99)
Glucose-Capillary: 181 mg/dL — ABNORMAL HIGH (ref 65–99)
Glucose-Capillary: 164 mg/dL — ABNORMAL HIGH (ref 65–99)

## 2017-04-19 LAB — BASIC METABOLIC PANEL
Anion gap: 6 (ref 5–15)
BUN: 7 mg/dL (ref 6–20)
CALCIUM: 8.2 mg/dL — AB (ref 8.9–10.3)
CO2: 28 mmol/L (ref 22–32)
Chloride: 100 mmol/L — ABNORMAL LOW (ref 101–111)
Creatinine, Ser: 0.68 mg/dL (ref 0.61–1.24)
GFR calc non Af Amer: >60 mL/min (ref >60)
Glucose, Bld: 124 mg/dL — ABNORMAL HIGH (ref 65–99)
Potassium: 4 mmol/L (ref 3.5–5.1)
SODIUM: 134 mmol/L — AB (ref 135–145)

## 2017-04-19 LAB — CBC
HCT: 35.6 % — ABNORMAL LOW (ref 40.0–52.0)
Hemoglobin: 12.3 g/dL — ABNORMAL LOW (ref 13.0–18.0)
MCH: 33.6 pg (ref 26.0–34.0)
MCHC: 34.6 g/dL (ref 32.0–36.0)
MCV: 96.9 fL (ref 80.0–100.0)
PLATELETS: 273 10*3/uL (ref 150–440)
RBC: 3.67 MIL/uL — AB (ref 4.40–5.90)
RDW: 12.5 % (ref 11.5–14.5)
WBC: 10.9 10*3/uL — AB (ref 3.8–10.6)

## 2017-04-19 LAB — VANCOMYCIN, TROUGH: VANCOMYCIN TR: 15 ug/mL (ref 15–20)

## 2017-04-19 LAB — HEMOGLOBIN A1C
HEMOGLOBIN A1C: 6.1 % — AB (ref 4.8–5.6)
MEAN PLASMA GLUCOSE: 128.37 mg/dL

## 2017-04-19 MED ORDER — ALUM & MAG HYDROXIDE-SIMETH 200-200-20 MG/5ML PO SUSP
30.0000 mL | ORAL | Status: DC | PRN
  Administered 2017-04-19: 30 mL via ORAL
  Filled 2017-04-19: qty 30

## 2017-04-19 NOTE — Progress Notes (Signed)
[facility] - [place] at [facility]   PATIENT NAME: Jesse Skinner    MR#:  [id number]  DATE OF BIRTH:  1970/03/07  SUBJECTIVE:  CHIEF COMPLAINT:   Chief Complaint  Patient presents with  . Abscess   Continues to have pain and redness in right axilla  REVIEW OF SYSTEMS:    Review of Systems  Constitutional: Positive for chills and fever.  HENT: Negative for sore throat.   Eyes: Negative for blurred vision, double vision and pain.  Respiratory: Negative for cough, hemoptysis, shortness of breath and wheezing.   Cardiovascular: Negative for chest pain, palpitations, orthopnea and leg swelling.  Gastrointestinal: Negative for abdominal pain, constipation, diarrhea, heartburn, nausea and vomiting.  Genitourinary: Negative for dysuria and hematuria.  Musculoskeletal: Negative for back pain and joint pain.  Skin: Negative for rash.  Neurological: Negative for sensory change, speech change, focal weakness and headaches.  Endo/Heme/Allergies: Does not bruise/bleed easily.  Psychiatric/Behavioral: Negative for depression. The patient is not nervous/anxious.     DRUG ALLERGIES:  No Known Allergies  VITALS:  Blood pressure (!) 153/91, pulse 95, temperature 98.1 F (36.7 C), temperature source Oral, resp. rate 20, height 5\' 7"  (1.702 m), weight 97.5 kg (215 lb), SpO2 100 %.  PHYSICAL EXAMINATION:   Physical Exam  GENERAL:  47 y.o.-year-old patient lying in the bed with no acute distress.  EYES: Pupils equal, round, reactive to light and accommodation. No scleral icterus. Extraocular muscles intact.  HEENT: Head atraumatic, normocephalic. Oropharynx and nasopharynx clear.  NECK:  Supple, no jugular venous distention. No thyroid enlargement, no tenderness.  LUNGS: Normal breath sounds bilaterally, no wheezing, rales, rhonchi. No use of accessory muscles of respiration.  CARDIOVASCULAR: S1, S2 normal. No murmurs, rubs, or gallops.  ABDOMEN: Soft, nontender,  nondistended. Bowel sounds present. No organomegaly or mass.  EXTREMITIES: No cyanosis, clubbing or edema b/l.    NEUROLOGIC: Cranial nerves II through XII are intact. No focal Motor or sensory deficits b/l.   PSYCHIATRIC: The patient is alert and oriented x 3.  SKIN: Redness and tenderness with induration in the right axilla extending laterally down the arm  LABORATORY PANEL:   CBC Recent Labs  Lab 04/19/17 0727  WBC 10.9*  HGB 12.3*  HCT 35.6*  PLT 273   ------------------------------------------------------------------------------------------------------------------ Chemistries  Recent Labs  Lab 04/18/17 1458 04/19/17 0727  NA 128* 134*  K 3.9 4.0  CL 95* 100*  CO2 23 28  GLUCOSE 191* 124*  BUN 8 7  CREATININE 0.73 0.68  CALCIUM 9.2 8.2*  AST 32  --   ALT 28  --   ALKPHOS 88  --   BILITOT 0.9  --    ------------------------------------------------------------------------------------------------------------------  Cardiac Enzymes No results for input(s): TROPONINI in the last 168 hours. ------------------------------------------------------------------------------------------------------------------  RADIOLOGY:  [place] Rt Upper Extrem Ltd Soft Tissue Non Vascular  Result Date: 04/18/2017 CLINICAL DATA:  47 year old male with drainage from the right axilla EXAM: ULTRASOUND RIGHT UPPER EXTREMITY LIMITED TECHNIQUE: Ultrasound examination of the upper extremity soft tissues was performed in the area of clinical concern. COMPARISON:  None FINDINGS: Directed duplex with grayscale and color imaging performed in the region clinical concern. There is a heterogeneously hypoechoic soft tissue focus in the right axilla measuring 3 cm x 0.9 cm which appears on the ultrasound survey to be continuous with the skin surface. No fluid collection.  No significant internal flow. IMPRESSION: Directed duplex in the region of clinical concern demonstrates focus of heterogeneously echoic soft  tissue, potentially  phlegmon/edema that is contiguous with the skin. No fluid identified. Electronically Signed   By: Gilmer MorJaime  Wagner D.O.   On: 04/18/2017 18:00     ASSESSMENT AND PLAN:   * Right axillary abscess and cellulitis with Sepsis POA Spontaneously draining. IV abx. Vancomycin and unasyn. IVF Blood and wound cx. Consult surgery for I n D. Discussed with Dr. Earlene Plateravis of surgery.  * DM Hold PO hypoglycemics. Insulin SSI  * HTN Continued homemedications  * Alcohol abuse CIWA protocol No withdrawals  * DVT prophylaxis Lovenox  All the records are reviewed and case discussed with Care Management/Social Workerr. Management plans discussed with the patient, family and they are in agreement.  CODE STATUS: FULL CODE  DVT Prophylaxis: SCDs  TOTAL TIME TAKING CARE OF THIS PATIENT: 30 minutes.   POSSIBLE D/C IN 2-3 DAYS, DEPENDING ON CLINICAL CONDITION.  Orie FishermanSrikar R Samone Guhl M.D on 04/19/2017 at 10:57 AM  Between 7am to 6pm - Pager - [phone]  After 6pm go to [email or web address] - password [email or web address]  [facility] [place] [facility]  Office  [phone]  CC: Primary care physician; Evelene CroonNiemeyer, Meindert, MD  Note: This dictation was prepared with Dragon dictation along with smaller phrase technology. Any transcriptional errors that result from this process are unintentional.

## 2017-04-19 NOTE — Progress Notes (Signed)
Pharmacy Antibiotic Note  Jesse Skinner is a 47 y.o. male admitted on 04/18/2017 with wound infection.  Pharmacy has been consulted for Unasyn and vancomycin dosing.  Plan: 1. Unasyn 3 gm IV Q6H 2. Vancomycin 1 gm IV x 1 in ED followed in approximately 6 hours (stacked dosing) by vancomycin 1.25 gm IV Q8H, predicted trough 17 mcg/mL, pharmacy will continue to follow and adjust as needed to maintain trough 15 to 20 mcg/mL.  Vd 55.1 L, Ke 0.109 hr-1, T1/2 6.4 hr  Height: 5\' 7"  (170.2 cm) Weight: 215 lb (97.5 kg) IBW/kg (Calculated) : 66.1  Temp (24hrs), Avg:98 F (36.7 C), Min:97.9 F (36.6 C), Max:98.1 F (36.7 C)  Recent Labs  Lab 04/18/17 1458 04/18/17 1853 04/19/17 0727 04/19/17 2135  WBC 15.9*  --  10.9*  --   CREATININE 0.73  --  0.68  --   LATICACIDVEN 2.1* 0.7  --   --   VANCOTROUGH  --   --   --  15    Estimated Creatinine Clearance: 125.7 mL/min (by C-G formula based on SCr of 0.68 mg/dL).    No Known Allergies  Antimicrobials this admission:   Dose adjustments/monitoring this admission: 4/17 PM vanc level 15. Continue current regimen   Microbiology results: 4/16 BCx: pending 4/16 WCx (axilla, right): pending  MRSA PCR:   Thank you for allowing pharmacy to be a part of this patient's care.  Juda Toepfer S, Pharm.D., BCPS Clinical Pharmacist 04/19/2017 10:56 PM

## 2017-04-20 DIAGNOSIS — L03113 Cellulitis of right upper limb: Secondary | ICD-10-CM

## 2017-04-20 DIAGNOSIS — L03111 Cellulitis of right axilla: Secondary | ICD-10-CM

## 2017-04-20 LAB — GLUCOSE, CAPILLARY
GLUCOSE-CAPILLARY: 120 mg/dL — AB (ref 65–99)
GLUCOSE-CAPILLARY: 155 mg/dL — AB (ref 65–99)
Glucose-Capillary: 144 mg/dL — ABNORMAL HIGH (ref 65–99)
Glucose-Capillary: 111 mg/dL — ABNORMAL HIGH (ref 65–99)

## 2017-04-20 LAB — HIV ANTIBODY (ROUTINE TESTING W REFLEX): HIV SCREEN 4TH GENERATION: NONREACTIVE

## 2017-04-20 MED ORDER — LIDOCAINE-EPINEPHRINE (PF) 1 %-1:200000 IJ SOLN
30.0000 mL | Freq: Once | INTRAMUSCULAR | Status: AC
  Administered 2017-04-20: 30 mL via INTRADERMAL
  Filled 2017-04-20: qty 30

## 2017-04-20 NOTE — Progress Notes (Signed)
[facility] - [place] at [facility]   PATIENT NAME: Jesse Skinner    MR#:  [id number]  DATE OF BIRTH:  23-Sep-1970  SUBJECTIVE:  CHIEF COMPLAINT:   Chief Complaint  Patient presents with  . Abscess   Continues to have pain and redness in right axilla.  REVIEW OF SYSTEMS:    Review of Systems  Constitutional: Positive for chills and fever.  HENT: Negative for sore throat.   Eyes: Negative for blurred vision, double vision and pain.  Respiratory: Negative for cough, hemoptysis, shortness of breath and wheezing.   Cardiovascular: Negative for chest pain, palpitations, orthopnea and leg swelling.  Gastrointestinal: Negative for abdominal pain, constipation, diarrhea, heartburn, nausea and vomiting.  Genitourinary: Negative for dysuria and hematuria.  Musculoskeletal: Negative for back pain and joint pain.  Skin: Negative for rash.  Neurological: Negative for sensory change, speech change, focal weakness and headaches.  Endo/Heme/Allergies: Does not bruise/bleed easily.  Psychiatric/Behavioral: Negative for depression. The patient is not nervous/anxious.     DRUG ALLERGIES:  No Known Allergies  VITALS:  Blood pressure 114/69, pulse 98, temperature 98.7 F (37.1 C), temperature source Oral, resp. rate 20, height 5\' 7"  (1.702 m), weight 97.5 kg (215 lb), SpO2 97 %.  PHYSICAL EXAMINATION:   Physical Exam  GENERAL:  47 y.o.-year-old patient lying in the bed with no acute distress.  EYES: Pupils equal, round, reactive to light and accommodation. No scleral icterus. Extraocular muscles intact.  HEENT: Head atraumatic, normocephalic. Oropharynx and nasopharynx clear.  NECK:  Supple, no jugular venous distention. No thyroid enlargement, no tenderness.  LUNGS: Normal breath sounds bilaterally, no wheezing, rales, rhonchi. No use of accessory muscles of respiration.  CARDIOVASCULAR: S1, S2 normal. No murmurs, rubs, or gallops.  ABDOMEN: Soft, nontender, nondistended.  Bowel sounds present. No organomegaly or mass.  EXTREMITIES: No cyanosis, clubbing or edema b/l.    NEUROLOGIC: Cranial nerves II through XII are intact. No focal Motor or sensory deficits b/l.   PSYCHIATRIC: The patient is alert and oriented x 3.  SKIN: Redness and tenderness with induration in the right axilla extending laterally down the arm  LABORATORY PANEL:   CBC Recent Labs  Lab 04/19/17 0727  WBC 10.9*  HGB 12.3*  HCT 35.6*  PLT 273   ------------------------------------------------------------------------------------------------------------------ Chemistries  Recent Labs  Lab 04/18/17 1458 04/19/17 0727  NA 128* 134*  K 3.9 4.0  CL 95* 100*  CO2 23 28  GLUCOSE 191* 124*  BUN 8 7  CREATININE 0.73 0.68  CALCIUM 9.2 8.2*  AST 32  --   ALT 28  --   ALKPHOS 88  --   BILITOT 0.9  --    ------------------------------------------------------------------------------------------------------------------  Cardiac Enzymes No results for input(s): TROPONINI in the last 168 hours. ------------------------------------------------------------------------------------------------------------------  RADIOLOGY:  [place] Rt Upper Extrem Ltd Soft Tissue Non Vascular  Result Date: 04/18/2017 CLINICAL DATA:  47 year old male with drainage from the right axilla EXAM: ULTRASOUND RIGHT UPPER EXTREMITY LIMITED TECHNIQUE: Ultrasound examination of the upper extremity soft tissues was performed in the area of clinical concern. COMPARISON:  None FINDINGS: Directed duplex with grayscale and color imaging performed in the region clinical concern. There is a heterogeneously hypoechoic soft tissue focus in the right axilla measuring 3 cm x 0.9 cm which appears on the ultrasound survey to be continuous with the skin surface. No fluid collection.  No significant internal flow. IMPRESSION: Directed duplex in the region of clinical concern demonstrates focus of heterogeneously echoic soft tissue,  potentially phlegmon/edema  that is contiguous with the skin. No fluid identified. Electronically Signed   By: Gilmer MorJaime  Wagner D.O.   On: 04/18/2017 18:00     ASSESSMENT AND PLAN:   * Right axillary abscess and cellulitis with Sepsis POA Spontaneously draining. IV abx. Vancomycin and unasyn. IVF Blood cx negative and wound cx with Staphylococcus Discussed with Dr. Earlene Plateravis to reevaluate the wound today.  * DM Hold PO hypoglycemics. Insulin SSI  * HTN Continued homemedications  * Alcohol abuse CIWA protocol No withdrawals  * DVT prophylaxis Lovenox  Likely another day or 2 in the hospital.  All the records are reviewed and case discussed with Care Management/Social Workerr. Management plans discussed with the patient, family and they are in agreement.  CODE STATUS: FULL CODE  DVT Prophylaxis: SCDs  TOTAL TIME TAKING CARE OF THIS PATIENT: 30 minutes.   POSSIBLE D/C IN 2-3 DAYS, DEPENDING ON CLINICAL CONDITION.  Orie FishermanSrikar R Laquia Rosano M.D on 04/20/2017 at 12:11 PM  Between 7am to 6pm - Pager - [phone]  After 6pm go to [email or web address] - password [email or web address]  [facility] [place] [facility]  Office  [phone]  CC: Primary care physician; Evelene CroonNiemeyer, Meindert, MD  Note: This dictation was prepared with Dragon dictation along with smaller phrase technology. Any transcriptional errors that result from this process are unintentional.

## 2017-04-20 NOTE — Progress Notes (Signed)
04/20/17  Discussed case with Dr. Earlene Plateravis.  Patient's right arm was worsening and started to have some drainage of purulent fluid this evening.  On exam, it's only a punctate area that was draining, whereas the area of fluctuance measures approximately 3 x 5 cm.  Recommend that he get a formal I&D procedure at bedside.  Discussed with patient risks of bleeding, infection, and injury to surrounding structures and he's willing to proceed.   Henrene DodgeJose Cashton Hosley, MD.

## 2017-04-20 NOTE — Op Note (Signed)
  Procedure Date:  04/20/2017  Pre-operative Diagnosis:  Right upper arm abscess  Post-operative Diagnosis:  Right upper arm and right axillary abscesses  Procedure:  Incision and Drainage of right upper arm and right axillary abscesses  Surgeon:  Howie IllJose Luis Crytal Pensinger, MD  Anesthesia:  10 ml of 1% lidocaine with epi  Estimated Blood Loss:  3 ml  Specimens:  Purulent fluid, sent for culture  Complications:  None  Indications for Procedure:  This is a 47 y.o. male with diagnosis of right upper arm and right axillary abscesses, requiring drainage procedure.  The risks of bleeding, abscess or infection, injury to surrounding structures, and need for further procedures were all discussed with the patient and was willing to proceed.  Description of Procedure: The patient was correctly identified at bedside.  Appropriate time-outs were performed prior to procedure.  The patient's right axilla and upper arm were prepped and draped in usual sterile fashion.  Local anesthetic was infused intradermally.  A cruciate 2 x 2 cm incision was made over the right upper arm abscess, revealing purulent fluid.  This fluid was swabbed for culture and sent to micro.  Small Kelly forceps were used to dissect around the abscess tissue to open any remaining pockets of purulent fluid.  After drainage was completed, the cavity was irrigated and cleaned.  At that point, it was noted that a very small area of fluctuance at the right axilla was draining fluid.  Local anesthetic was infused at this abscess point and a stab wound was created to drain the purulent fluid. The wounds were cleaned and the right upper arm wound was packed with 2x2 gauze and both wounds were covered with dry gauze and tape.  The patient tolerated the procedure well and all sharps were appropriately disposed of at the end of the case.   Howie IllJose Luis Davyn Elsasser, MD

## 2017-04-20 NOTE — Discharge Instructions (Addendum)
Change dressing daily.  Do not drive while takin  Cellulitis, Adult Cellulitis is a skin infection. The infected area is usually red and sore. This condition occurs most often in the arms and lower legs. It is very important to get treated for this condition. Follow these instructions at home:  Take over-the-counter and prescription medicines only as told by your doctor.  If you were prescribed an antibiotic medicine, take it as told by your doctor. Do not stop taking the antibiotic even if you start to feel better.  Drink enough fluid to keep your pee (urine) clear or pale yellow.  Do not touch or rub the infected area.  Raise (elevate) the infected area above the level of your heart while you are sitting or lying down.  Place warm or cold wet cloths (warm or cold compresses) on the infected area. Do this as told by your doctor.  Keep all follow-up visits as told by your doctor. This is important. These visits let your doctor make sure your infection is not getting worse. Contact a doctor if:  You have a fever.  Your symptoms do not get better after 1-2 days of treatment.  Your bone or joint under the infected area starts to hurt after the skin has healed.  Your infection comes back. This can happen in the same area or another area.  You have a swollen bump in the infected area.  You have new symptoms.  You feel ill and also have muscle aches and pains. Get help right away if:  Your symptoms get worse.  You feel very sleepy.  You throw up (vomit) or have watery poop (diarrhea) for a long time.  There are red streaks coming from the infected area.  Your red area gets larger.  Your red area turns darker. This information is not intended to replace advice given to you by your health care provider. Make sure you discuss any questions you have with your health care provider. Document Released: 06/08/2007 Document Revised: 05/28/2015 Document Reviewed:  10/29/2014 Elsevier Interactive Patient Education  2018 Elsevier Inc[employer] g pain medications.

## 2017-04-20 NOTE — Consult Note (Signed)
SURGICAL CONSULTATION NOTE (initial) - cpt: 44010  HISTORY OF PRESENT ILLNESS (HPI):  47 y.o. male presented to [facility] ED yesterday for evaluation of Right proximal arm erythema extending up towards and to his Right axilla. Patient reports Right proximal arm pain and redness along with lumps in his Right axilla and reported white axillary drainage x 5 days (since Wednesday), which has since ceased. Patient also admits to drinking 5 beers per day and poorly controlled blood glucose recently with subjective fevers, but has not checked his temperature to confirm. He otherwise denies N/V, CP, or SOB and describes no prior axillary lumps or drainage.  Surgery is consulted by medical physician Dr. Darvin Neighbours in this context for evaluation and management of Right axillary abscess.  PAST MEDICAL HISTORY (PMH):  Past Medical History:  Diagnosis Date  . Diabetes mellitus without complication ([place])   . Hypertension      PAST SURGICAL HISTORY ([place]):  Past Surgical History:  Procedure Laterality Date  . BACK SURGERY    . TOTAL HIP ARTHROPLASTY Left      MEDICATIONS:  Prior to Admission medications   Medication Sig Start Date End Date Taking? Authorizing Provider  aspirin EC 81 MG tablet Take 81 mg by mouth daily.   Yes [provider]  gabapentin (NEURONTIN) 400 MG capsule Take 800 mg by mouth 3 (three) times daily.   Yes [provider]  glyBURIDE-metformin (GLUCOVANCE) 2.5-500 MG tablet Take 2 tablets by mouth 2 (two) times daily with a meal.  09/06/16  Yes [provider]  lisinopril (PRINIVIL,ZESTRIL) 20 MG tablet Take 20 mg by mouth daily.   Yes [provider]  meloxicam (MOBIC) 7.5 MG tablet Take 7.5 mg by mouth daily as needed for pain.   Yes [provider]  metoprolol succinate (TOPROL-XL) 50 MG 24 hr tablet Take 50 mg by mouth daily. 09/06/16  Yes [provider]  Multiple Vitamins-Minerals (CENTRUM ADULTS PO) Take 1 tablet by mouth daily.    Yes [provider]  omeprazole (PRILOSEC) 40 MG capsule Take 40 mg by mouth daily. 09/06/16  Yes [provider]  simvastatin (ZOCOR) 40 MG tablet Take 40 mg by mouth daily. 09/06/16  Yes [provider]  amoxicillin (AMOXIL) 500 MG capsule Take 500 mg by mouth.    [provider]  mupirocin ointment (BACTROBAN) 2 % Apply to affected area 3 times daily Patient not taking: Reported on 04/18/2017 09/14/16 09/14/17  Earleen Newport, MD  oxyCODONE-acetaminophen (PERCOCET) 7.5-325 MG tablet Take 1 tablet by mouth every 4 (four) hours as needed for severe pain. Patient not taking: Reported on 04/18/2017 09/14/16 09/14/17  Earleen Newport, MD     ALLERGIES:  No Known Allergies   SOCIAL HISTORY:  Social History   Socioeconomic History  . Marital status: Married    Spouse name: Not on file  . Number of children: Not on file  . Years of education: Not on file  . Highest education level: Not on file  Occupational History  . Not on file  Social Needs  . Financial resource strain: Not on file  . Food insecurity:    Worry: Not on file    Inability: Not on file  . Transportation needs:    Medical: Not on file    Non-medical: Not on file  Tobacco Use  . Smoking status: Current Some Day Smoker  . Smokeless tobacco: Never Used  Substance and Sexual Activity  . Alcohol use: Yes  . Drug use:  Not on file  . Sexual activity: Not on file  Lifestyle  . Physical activity:    Days per week: Not on file    Minutes per session: Not on file  . Stress: Not on file  Relationships  . Social connections:    Talks on phone: Not on file    Gets together: Not on file    Attends religious service: Not on file    Active member of club or organization: Not on file    Attends meetings of clubs or organizations: Not on file    Relationship status: Not on file  . Intimate partner violence:    Fear of current or ex partner: Not on file    Emotionally abused: Not on  file    Physically abused: Not on file    Forced sexual activity: Not on file  Other Topics Concern  . Not on file  Social History Narrative  . Not on file    The patient currently resides (home / rehab facility / nursing home): Home The patient normally is (ambulatory / bedbound): Ambulatory   FAMILY HISTORY:  Family History  Problem Relation Age of Onset  . Skin cancer Neg Hx      REVIEW OF SYSTEMS:  Constitutional: denies weight loss, fever, chills, or sweats  Eyes: denies any other vision changes, history of eye injury  ENT: denies sore throat, hearing problems  Respiratory: denies shortness of breath, wheezing  Cardiovascular: denies chest pain, palpitations  Gastrointestinal: denies abdominal pain, N/V, or diarrhea Genitourinary: denies burning with urination or urinary frequency Musculoskeletal: denies any other joint pains or cramps  Skin: denies any other rashes or skin discolorations except as per HPI Neurological: denies any other headache, dizziness, weakness  Psychiatric: denies any other depression, anxiety   All other review of systems were negative   VITAL SIGNS:  Temp:  [97.9 F (36.6 C)-98.7 F (37.1 C)] 98.7 F (37.1 C) (04/18 0531) Pulse Rate:  [85-98] 98 (04/18 0531) Resp:  [20] 20 (04/17 2007) BP: (114-137)/(69-86) 114/69 (04/18 0531) SpO2:  [97 %-98 %] 97 % (04/18 0531)     Height: '5\' 7"'$  (170.2 cm) Weight: 215 lb (97.5 kg) BMI (Calculated): 33.67   INTAKE/OUTPUT:  This shift: Total I/O In: 240 [P.O.:240] Out: 500 [Urine:500]  Last 2 shifts: '@IOLAST2SHIFTS'$ @   PHYSICAL EXAM:  Constitutional:  -- Overweight body habitus  -- Awake, alert, and oriented x3, no apparent distress Eyes:  -- Pupils equally round and reactive to light  -- No scleral icterus, B/L no occular discharge Ear, nose, throat: -- Neck is FROM WNL -- No jugular venous distension  Pulmonary:  -- No wheezes or rhales -- Equal breath sounds bilaterally -- Breathing  non-labored at rest Cardiovascular:  -- S1, S2 present  -- No pericardial rubs  Gastrointestinal:  -- Abdomen soft, nontender, non-distended, no guarding or rebound tenderness -- No abdominal masses appreciated, pulsatile or otherwise  Musculoskeletal and Integumentary:  -- Wounds or skin discoloration: Right axillary firm masses consistent with lymphadenopathy vs less likely hydradenitis vs less likely sebaceous cyst(s) with no Right axillary erythema, which really begins and is most focused at tender proximal Right arm induration without axillary or proximal Right arm fluctuance to suggest abscess -- Extremities: B/L UE and LE FROM, hands and feet warm, mild proximal Right arm associated with cellulitis  Neurologic:  -- Motor function: Intact and symmetric -- Sensation: Intact and symmetric Psychiatric:  -- Mood and affect WNL  Labs:  CBC Latest  Ref Rng & Units 04/19/2017 04/18/2017 09/14/2016  WBC 3.8 - 10.6 K/uL 10.9(H) 15.9(H) 13.8(H)  Hemoglobin 13.0 - 18.0 g/dL 12.3(L) 14.6 14.6  Hematocrit 40.0 - 52.0 % 35.6(L) 42.0 41.7  Platelets 150 - 440 K/uL 273 336 286   CMP Latest Ref Rng & Units 04/19/2017 04/18/2017 09/14/2016  Glucose 65 - 99 mg/dL 124(H) 191(H) 170(H)  BUN 6 - 20 mg/dL '7 8 9  '$ Creatinine 0.61 - 1.24 mg/dL 0.68 0.73 0.92  Sodium 135 - 145 mmol/L 134(L) 128(L) 136  Potassium 3.5 - 5.1 mmol/L 4.0 3.9 3.6  Chloride 101 - 111 mmol/L 100(L) 95(L) 102  CO2 22 - 32 mmol/L '28 23 24  '$ Calcium 8.9 - 10.3 mg/dL 8.2(L) 9.2 9.1  Total Protein 6.5 - 8.1 g/dL - 8.2(H) 7.7  Total Bilirubin 0.3 - 1.2 mg/dL - 0.9 0.9  Alkaline Phos 38 - 126 U/L - 88 86  AST 15 - 41 U/L - 32 49(H)  ALT 17 - 63 U/L - 28 48   Imaging studies:  RUE Ultrasound (04/18/2017) Directed duplex in the region of clinical concern demonstrates focus of heterogeneously echoic soft tissue, potentially phlegmon/edema that is contiguous with the skin. No fluid identified.  Assessment/Plan: (ICD-10's: L03.113,  R59.0) 47 y.o. male who met sepsis criteria at admission attributable to Right proximal arm cellulitis of uncertain etiology with Right axillary focal lymphadenopathy vs less likely hydradenitis or sebaceous cyst(s) without fluctuance to suggest abscess at this time, complicated by pertinent comorbidities including obesity (BMI 34), DM, HTN, and chronic ongoing daily alcohol and tobacco (smoking) abuse.   - continue antibiotics   - currently no indication for surgical intervention   - medical management of comorbidities as per primary medical team  - smoking and alcohol cessation advised and discussed  - please call if abscess/fluctuance develops   - DVT prophylaxis  All of the above findings and recommendations were discussed with the patient and his RN, and all of patient's questions were answered to his expressed satisfaction.  Thank you for the opportunity to participate in this patient's care.   -- Marilynne Drivers Rosana Hoes, MD, [place]: [facility] - Partnering for exceptional care. Office: [phone]

## 2017-04-20 NOTE — Progress Notes (Signed)
SURGICAL PROGRESS NOTE (cpt [phone])  Hospital Day(s): 2.   Post op day(s):  Jesse Skinner   Interval History: Patient seen and examined, no acute events or new complaints overnight. Patient reports his Right upper arm has become somewhat more red over a somewhat smaller area with development of a "spongy" area in the center of the yesterday firm focus of induration and erythema. Patient describes his pain has been overall controlled, and he denies fever/chills, N/V, CP, or SOB.  Review of Systems:  Constitutional: denies fever, chills  HEENT: denies cough or congestion  Respiratory: denies any shortness of breath  Cardiovascular: denies chest pain or palpitations  Gastrointestinal: denies abdominal pain, N/V, or diarrhea Genitourinary: denies burning with urination or urinary frequency Musculoskeletal: denies pain, decreased motor or sensation Integumentary: denies any other rashes or skin discolorations except as per HPI Neurological: denies HA or vision/hearing changes   Vital signs in last 24 hours: [min-max] current  Temp:  [97.9 F (36.6 C)-98.7 F (37.1 C)] 97.9 F (36.6 C) (04/18 1413) Pulse Rate:  [82-98] 82 (04/18 1413) Resp:  [14-20] 14 (04/18 1413) BP: (114-127)/(69-86) 125/84 (04/18 1413) SpO2:  [97 %-100 %] 100 % (04/18 1413)     Height: '5\' 7"'$  (170.2 cm) Weight: 215 lb (97.5 kg) BMI (Calculated): 33.67   Intake/Output this shift:  Total I/O In: 240 [P.O.:240] Out: -    Intake/Output last 2 shifts:  '@IOLAST2SHIFTS'$ @   Physical Exam:  Constitutional: alert, cooperative and no distress  HENT: normocephalic without obvious abnormality  Eyes: PERRL, EOM's grossly intact and symmetric  Neuro: CN II - XII grossly intact and symmetric without deficit  Respiratory: breathing non-labored at rest  Cardiovascular: regular rate and sinus rhythm  Gastrointestinal: soft, non-tender, and non-distended Musculoskeletal: UE and LE FROM, no edema or wounds except mild proximal Right arm  edema associated with cellulitis, motor and sensation grossly intact, NT Integumentary: somewhat decreased Right axillary firm masses consistent with lymphadenopathy vs less likely hydradenitis vs less likely sebaceous cyst(s) with no Right axillary erythema, which really begins and is most focused at tender proximal Right arm induration now also with a focus of proximal Right arm fluctuance suggestive of developing abscess    Labs:  CBC Latest Ref Rng & Units 04/19/2017 04/18/2017 09/14/2016  WBC 3.8 - 10.6 K/uL 10.9(H) 15.9(H) 13.8(H)  Hemoglobin 13.0 - 18.0 g/dL 12.3(L) 14.6 14.6  Hematocrit 40.0 - 52.0 % 35.6(L) 42.0 41.7  Platelets 150 - 440 K/uL 273 336 286   CMP Latest Ref Rng & Units 04/19/2017 04/18/2017 09/14/2016  Glucose 65 - 99 mg/dL 124(H) 191(H) 170(H)  BUN 6 - 20 mg/dL '7 8 9  '$ Creatinine 0.61 - 1.24 mg/dL 0.68 0.73 0.92  Sodium 135 - 145 mmol/L 134(L) 128(L) 136  Potassium 3.5 - 5.1 mmol/L 4.0 3.9 3.6  Chloride 101 - 111 mmol/L 100(L) 95(L) 102  CO2 22 - 32 mmol/L '28 23 24  '$ Calcium 8.9 - 10.3 mg/dL 8.2(L) 9.2 9.1  Total Protein 6.5 - 8.1 g/dL - 8.2(H) 7.7  Total Bilirubin 0.3 - 1.2 mg/dL - 0.9 0.9  Alkaline Phos 38 - 126 U/L - 88 86  AST 15 - 41 U/L - 32 49(H)  ALT 17 - 63 U/L - 28 48   Imaging studies: No new pertinent imaging studies   Assessment/Plan: (ICD-10's: L03.113, R59.0) 47 y.o. male who met sepsis criteria at admission attributable to Right proximal arm cellulitis of uncertain etiology with Right axillary focal lymphadenopathy vs less likely hydradenitis or sebaceous  cyst(s), now with a focus of proximal Right arm fluctuance suggestive of a developing abscess, complicated by pertinent comorbidities including obesity (BMI 34), DM, HTN, and chronic ongoing daily alcohol and tobacco (smoking) abuse.              - continue antibiotics              - all risks, benefits, and alternatives to incision and drainage of proximal Right arm abscess were discussed with the  patient and his wife, all of their questions were answered to their expressed satisfaction, patient expresses he wishes to proceed, and informed consent was obtained.  - will plan for bedside incision and drainage of proximal Right arm abscess tonight or tomorrow             - medical management of comorbidities as per primary medical team             - smoking and alcohol cessation advised and discussed             - DVT prophylaxis, ambulation encouraged  All of the above findings and recommendations were discussed with the patient, his wife, and his RN, and all of patient's and his family's questions were answered to their expressed satisfaction.  Thank you for the opportunity to participate in this patient's care.  -- Marilynne Drivers Rosana Hoes, MD, [place]: [facility] - Partnering for exceptional care. Office: [phone]

## 2017-04-21 LAB — CREATININE, SERUM
Creatinine, Ser: 0.95 mg/dL (ref 0.61–1.24)
GFR calc Af Amer: >60 mL/min (ref >60)

## 2017-04-21 LAB — GLUCOSE, CAPILLARY
GLUCOSE-CAPILLARY: 157 mg/dL — AB (ref 65–99)
GLUCOSE-CAPILLARY: 123 mg/dL — AB (ref 65–99)
GLUCOSE-CAPILLARY: 194 mg/dL — AB (ref 65–99)
Glucose-Capillary: 179 mg/dL — ABNORMAL HIGH (ref 65–99)

## 2017-04-21 LAB — AEROBIC CULTURE W GRAM STAIN (SUPERFICIAL SPECIMEN)

## 2017-04-21 LAB — AEROBIC CULTURE  (SUPERFICIAL SPECIMEN)

## 2017-04-21 MED ORDER — FLEET ENEMA 7-19 GM/118ML RE ENEM
1.0000 | ENEMA | Freq: Once | RECTAL | Status: AC
  Administered 2017-04-21: 1 via RECTAL

## 2017-04-21 NOTE — Progress Notes (Signed)
[facility] - [place] at [facility]   PATIENT NAME: Jesse Skinner    MR#:  [id number]  DATE OF BIRTH:  1970/10/03  SUBJECTIVE:  CHIEF COMPLAINT:   Chief Complaint  Patient presents with  . Abscess   Continues to have pain upper arm and axilla.  Status post incision and drainage  REVIEW OF SYSTEMS:    Review of Systems  Constitutional: Positive for chills and fever.  HENT: Negative for sore throat.   Eyes: Negative for blurred vision, double vision and pain.  Respiratory: Negative for cough, hemoptysis, shortness of breath and wheezing.   Cardiovascular: Negative for chest pain, palpitations, orthopnea and leg swelling.  Gastrointestinal: Negative for abdominal pain, constipation, diarrhea, heartburn, nausea and vomiting.  Genitourinary: Negative for dysuria and hematuria.  Musculoskeletal: Negative for back pain and joint pain.  Skin: Negative for rash.  Neurological: Negative for sensory change, speech change, focal weakness and headaches.  Endo/Heme/Allergies: Does not bruise/bleed easily.  Psychiatric/Behavioral: Negative for depression. The patient is not nervous/anxious.     DRUG ALLERGIES:  No Known Allergies  VITALS:  Blood pressure (!) 148/91, pulse 86, temperature 97.9 F (36.6 C), temperature source Oral, resp. rate 20, height 5\' 7"  (1.702 m), weight 97.5 kg (215 lb), SpO2 98 %.  PHYSICAL EXAMINATION:   Physical Exam  GENERAL:  47 y.o.-year-old patient lying in the bed with no acute distress.  EYES: Pupils equal, round, reactive to light and accommodation. No scleral icterus. Extraocular muscles intact.  HEENT: Head atraumatic, normocephalic. Oropharynx and nasopharynx clear.  NECK:  Supple, no jugular venous distention. No thyroid enlargement, no tenderness.  LUNGS: Normal breath sounds bilaterally, no wheezing, rales, rhonchi. No use of accessory muscles of respiration.  CARDIOVASCULAR: S1, S2 normal. No murmurs, rubs, or gallops.   ABDOMEN: Soft, nontender, nondistended. Bowel sounds present. No organomegaly or mass.  EXTREMITIES: No cyanosis, clubbing or edema b/l.    NEUROLOGIC: Cranial nerves II through XII are intact. No focal Motor or sensory deficits b/l.   PSYCHIATRIC: The patient is alert and oriented x 3.  SKIN: Redness and tenderness with induration in the right axilla extending laterally down the arm  LABORATORY PANEL:   CBC Recent Labs  Lab 04/19/17 0727  WBC 10.9*  HGB 12.3*  HCT 35.6*  PLT 273   ------------------------------------------------------------------------------------------------------------------ Chemistries  Recent Labs  Lab 04/18/17 1458 04/19/17 0727 04/21/17 0547  NA 128* 134*  --   K 3.9 4.0  --   CL 95* 100*  --   CO2 23 28  --   GLUCOSE 191* 124*  --   BUN 8 7  --   CREATININE 0.73 0.68 0.95  CALCIUM 9.2 8.2*  --   AST 32  --   --   ALT 28  --   --   ALKPHOS 88  --   --   BILITOT 0.9  --   --    ------------------------------------------------------------------------------------------------------------------  Cardiac Enzymes No results for input(s): TROPONINI in the last 168 hours. ------------------------------------------------------------------------------------------------------------------  RADIOLOGY:  No results found.   ASSESSMENT AND PLAN:   * Right axillary abscess and cellulitis with Sepsis POA Spontaneously draining.  Also incision and drainage of upper arm abscess IV vancomycin.  Wound cultures with MRSA Likely discharge tomorrow on oral medications  * DM Hold PO hypoglycemics. Insulin SSI  * HTN Continued home medications  * Alcohol abuse CIWA protocol No withdrawals  * DVT prophylaxis Lovenox  All the records are reviewed and case discussed with  Care Management/Social Workerr. Management plans discussed with the patient, family and they are in agreement.  CODE STATUS: FULL CODE  DVT Prophylaxis: SCDs  TOTAL TIME  TAKING CARE OF THIS PATIENT: 30 minutes.   POSSIBLE D/C IN 2-3 DAYS, DEPENDING ON CLINICAL CONDITION.  Jesse Skinner on 04/21/2017 at 10:47 AM  Between 7am to 6pm - Pager - [phone]  After 6pm go to [email or web address] - password [email or web address]  [facility]  Office  [phone]  CC: Primary care physician; Evelene CroonNiemeyer, [place], MD  Note: This dictation was prepared with Dragon dictation along with smaller phrase technology. Any transcriptional errors that result from this process are unintentional.

## 2017-04-21 NOTE — Progress Notes (Signed)
SURGICAL PROGRESS NOTE  Hospital Day(s): 3.   Post op day(s):  Jesse Skinner.   Interval History: Patient seen and examined, underwent incision and drainage of proximal Right arm abscess overnight with drainage of small punctate axillary pustule as well. Patient reports his arm remains sore, but describes the "pressure" at the drained site has improved. He otherwise denies fever/chills, N/V, diarrhea, constipation, CP, or SOB.  Review of Systems:  Constitutional: denies fever, chills  HEENT: denies cough or congestion  Respiratory: denies any shortness of breath  Cardiovascular: denies chest pain or palpitations  Gastrointestinal: denies abdominal pain, N/V, or diarrhea Genitourinary: denies burning with urination or urinary frequency Musculoskeletal: denies pain, decreased motor or sensation Integumentary: denies any other rashes or skin discolorations except as per HPI Neurological: denies HA or vision/hearing changes   Vital signs in last 24 hours: [min-max] current  Temp:  [97.9 F (36.6 C)-98.2 F (36.8 C)] 97.9 F (36.6 C) (04/19 0436) Pulse Rate:  [82-94] 86 (04/19 0910) Resp:  [14-20] 20 (04/19 0436) BP: (125-155)/(84-104) 148/91 (04/19 0910) SpO2:  [94 %-100 %] 98 % (04/19 0436)     Height: 5\' 7"  (170.2 cm) Weight: 215 lb (97.5 kg) BMI (Calculated): 33.67   Intake/Output this shift:  No intake/output data recorded.   Intake/Output last 2 shifts:  @IOLAST2SHIFTS @   Physical Exam:  Constitutional: alert, cooperative and no distress  HENT: normocephalic without obvious abnormality  Eyes: PERRL, EOM's grossly intact and symmetric  Neuro: CN II - XII grossly intact and symmetric without deficit  Respiratory: breathing non-labored at rest  Cardiovascular: regular rate and sinus rhythm  Gastrointestinal: soft, non-tender, and non-distended Musculoskeletal: UE and LE FROM, motor and sensation grossly intact, NT Integumentary: stable to slightly decreased Right proximal arm  erythema s/p incision and drainage of Right proximal arm abscess with drainage-stained gauze packing, moderately tender to palpation  Labs:  CBC Latest Ref Rng & Units 04/19/2017 04/18/2017 09/14/2016  WBC 3.8 - 10.6 K/uL 10.9(H) 15.9(H) 13.8(H)  Hemoglobin 13.0 - 18.0 g/dL 12.3(L) 14.6 14.6  Hematocrit 40.0 - 52.0 % 35.6(L) 42.0 41.7  Platelets 150 - 440 K/uL 273 336 286   CMP Latest Ref Rng & Units 04/21/2017 04/19/2017 04/18/2017  Glucose 65 - 99 mg/dL - 161(W124(H) 960(A191(H)  BUN 6 - 20 mg/dL - 7 8  Creatinine 5.400.61 - 1.24 mg/dL 9.810.95 1.910.68 4.780.73  Sodium 135 - 145 mmol/L - 134(L) 128(L)  Potassium 3.5 - 5.1 mmol/L - 4.0 3.9  Chloride 101 - 111 mmol/L - 100(L) 95(L)  CO2 22 - 32 mmol/L - 28 23  Calcium 8.9 - 10.3 mg/dL - 8.2(L) 9.2  Total Protein 6.5 - 8.1 g/dL - - 8.2(H)  Total Bilirubin 0.3 - 1.2 mg/dL - - 0.9  Alkaline Phos 38 - 126 U/L - - 88  AST 15 - 41 U/L - - 32  ALT 17 - 63 U/L - - 28   Imaging studies: No new pertinent imaging studies   Assessment/Plan: (ICD-10's:L03.113, R59.0) 47 y.o.[place] okay, 1 Day Post-Op s/p incision and drainage of Right proximal arm abscess with cellulitis of uncertain etiology and focal extensive Right axillary lymphadenopathy vs less likely hydradenitis or sebaceous cyst(s), complicated by comorbidities includingobesity (BMI 34), DM, HTN, and chronic ongoing daily alcohol and tobacco (smoking) abuse.  - continue antibiotics - follow-up pending cultures and tailor antibiotic therapy accordingly - medical management of comorbidities as per primary medical team  - outpatient follow-up in 1 week in our office or with primary care physician -  smoking and alcohol cessation advised and discussed - DVT prophylaxis, ambulation encouraged  All of the above findings and recommendations were discussed with the patient, and all of his questions were answered to his expressed satisfaction.  Thank  you for the opportunity to participate in this patient's care.  -- Scherrie Gerlach Earlene Plater, MD, RPVI [place]: [facility] - Partnering for exceptional care. Office: [phone]

## 2017-04-22 LAB — GLUCOSE, CAPILLARY: Glucose-Capillary: 190 mg/dL — ABNORMAL HIGH (ref 65–99)

## 2017-04-22 MED ORDER — HYDROCODONE-ACETAMINOPHEN 5-325 MG PO TABS: 1.0000 | ORAL_TABLET | Freq: Four times a day (QID) | ORAL | 0 refills | Status: AC | PRN

## 2017-04-22 MED ORDER — SULFAMETHOXAZOLE-TRIMETHOPRIM 800-160 MG PO TABS: 1.0000 | ORAL_TABLET | Freq: Two times a day (BID) | ORAL | 0 refills | Status: AC

## 2017-04-22 NOTE — Plan of Care (Signed)
Patient is ambulating in the hallway and pain is better controlled.  Jesse Skinner, Jesse Skinner N

## 2017-04-22 NOTE — Progress Notes (Addendum)
Patient discharge teaching given, including activity, diet, follow-up appoints, and medications. Patient verbalized understanding of all discharge instructions. IV access was d/c'd. Wound dressing change was demonstrated with pt and wife, demonstrated understanding with the teach back method. Supplies were given to pt. Vitals are stable. Skin is intact except as charted in most recent assessments. Pt refused to be escorted out, to be driven home by family.   Jesse Skinner

## 2017-04-23 DIAGNOSIS — R590 Localized enlarged lymph nodes: Secondary | ICD-10-CM

## 2017-04-23 LAB — CULTURE, BLOOD (ROUTINE X 2)
Culture: NO GROWTH
Culture: NO GROWTH

## 2017-04-26 LAB — AEROBIC/ANAEROBIC CULTURE (SURGICAL/DEEP WOUND)

## 2017-04-26 LAB — AEROBIC/ANAEROBIC CULTURE W GRAM STAIN (SURGICAL/DEEP WOUND)

## 2017-05-03 NOTE — Discharge Summary (Signed)
[facility] [place] [facility]   PATIENT NAME: Jesse Skinner    MR#:  [id number]  DATE OF BIRTH:  02-19-1970  DATE OF ADMISSION:  04/18/2017 ADMITTING PHYSICIAN: Milagros Loll, MD  DATE OF DISCHARGE: 04/22/2017 11:56 AM  PRIMARY CARE PHYSICIAN: Evelene Croon, MD   ADMISSION DIAGNOSIS:  Cellulitis [L03.90] Cellulitis of right upper extremity [L03.113] Cellulitis of axilla [L03.119]  DISCHARGE DIAGNOSIS:  Active Problems:   Cellulitis of axilla   Cellulitis of right upper extremity   Axillary lymphadenopathy   SECONDARY DIAGNOSIS:   Past Medical History:  Diagnosis Date  . Diabetes mellitus without complication (HCC)   . Hypertension      ADMITTING HISTORY  HISTORY OF PRESENT ILLNESS:  Jesse Skinner  is a 47 y.o. male with a known history of DM, HTN, alcohol abuse presents with right axillary swelling and discharge. For past 5 days. He presented today cause the redness I extending down the arm now. Here he has tachycardia, leucocytosis, elevated lactic acid and sepsis. Being admitted.     HOSPITAL COURSE:   *Right axillary abscess with surrounding cellulitis. *Sepsis present on admission *MRSA and wound cultures *Diabetes mellitus *Hypertension *Alcohol abuse   Patient was started on broad-spectrum antibiotics on admission.  Seen by surgery and had incision and drainage.  Wound cultures grew MRSA.  Patient is being switched to oral antibiotics at discharge.  Packing daily at home.  Follow-up with surgery as outpatient in 1 week.  Sepsis has resolved.  Diabetes and hypertension well controlled during the hospital stay.  No withdrawal from his alcohol use.  Discharged home in stable condition.  CONSULTS OBTAINED:    DRUG ALLERGIES:  No Known Allergies  DISCHARGE MEDICATIONS:   Allergies as of 04/22/2017   No Known Allergies     Medication List    TAKE these medications   amoxicillin 500 MG capsule Commonly known as:   AMOXIL Take 500 mg by mouth.   aspirin EC 81 MG tablet Take 81 mg by mouth daily.   CENTRUM ADULTS PO Take 1 tablet by mouth daily.   gabapentin 400 MG capsule Commonly known as:  NEURONTIN Take 800 mg by mouth 3 (three) times daily.   glyBURIDE-metformin 2.5-500 MG tablet Commonly known as:  GLUCOVANCE Take 2 tablets by mouth 2 (two) times daily with a meal.   lisinopril 20 MG tablet Commonly known as:  PRINIVIL,ZESTRIL Take 20 mg by mouth daily.   meloxicam 7.5 MG tablet Commonly known as:  MOBIC Take 7.5 mg by mouth daily as needed for pain.   metoprolol succinate 50 MG 24 hr tablet Commonly known as:  TOPROL-XL Take 50 mg by mouth daily.   mupirocin ointment 2 % Commonly known as:  BACTROBAN Apply to affected area 3 times daily   omeprazole 40 MG capsule Commonly known as:  PRILOSEC Take 40 mg by mouth daily.   oxyCODONE-acetaminophen 7.5-325 MG tablet Commonly known as:  PERCOCET Take 1 tablet by mouth every 4 (four) hours as needed for severe pain.   simvastatin 40 MG tablet Commonly known as:  ZOCOR Take 40 mg by mouth daily.   sulfamethoxazole-trimethoprim 800-160 MG tablet Commonly known as:  BACTRIM DS,SEPTRA DS Take 1 tablet by mouth 2 (two) times daily.     ASK your doctor about these medications   HYDROcodone-acetaminophen 5-325 MG tablet Commonly known as:  NORCO/VICODIN Take 1-2 tablets by mouth every 6 (six) hours as needed for up to 3 days for severe pain. Ask  about: Should I take this medication?       Today   VITAL SIGNS:  Blood pressure (!) 154/94, pulse 92, temperature 97.8 F (36.6 C), temperature source Oral, resp. rate 20, height  (1.702 m), weight 97.5 kg (215 lb), SpO2 99 %.  I/O:  No intake or output data in the 24 hours ending 05/03/17 1209  PHYSICAL EXAMINATION:  Physical Exam  GENERAL:  47 y.o.-year-old patient lying in the bed with no acute distress.  LUNGS: Normal breath sounds bilaterally, no wheezing,  rales,rhonchi or crepitation. No use of accessory muscles of respiration.  CARDIOVASCULAR: S1, S2 normal. No murmurs, rubs, or gallops.  ABDOMEN: Soft, non-tender, non-distended. Bowel sounds present. No organomegaly or mass.  NEUROLOGIC: Moves all 4 extremities. PSYCHIATRIC: The patient is alert and oriented x 3.  SKIN: Right axillary pad area with minimal discharge.  Surrounding erythema is much improved.  DATA REVIEW:   CBC No results for input(s): WBC, HGB, HCT, PLT in the last 168 hours.  Chemistries  No results for input(s): NA, K, CL, CO2, GLUCOSE, BUN, CREATININE, CALCIUM, MG, AST, ALT, ALKPHOS, BILITOT in the last 168 hours.  Invalid input(s): GFRCGP  Cardiac Enzymes No results for input(s): TROPONINI in the last 168 hours.  Microbiology Results  Results for orders placed or performed during the hospital encounter of 04/18/17  Culture, blood (Routine x 2)     Status: None   Collection Time: 04/18/17  2:58 PM  Result Value Ref Range Status   Specimen Description BLOOD LEFT ARM  Final   Special Requests   Final    BOTTLES DRAWN AEROBIC AND ANAEROBIC Blood Culture results may not be optimal due to an excessive volume of blood received in culture bottles   Culture   Final    NO GROWTH 5 DAYS Performed at [facility], [address] [place], [place] [address]    Report Status 04/23/2017 FINAL  Final  Culture, blood (Routine x 2)     Status: None   Collection Time: 04/18/17  4:27 PM  Result Value Ref Range Status   Specimen Description BLOOD LEFT ANTECUBITAL  Final   Special Requests   Final    BOTTLES DRAWN AEROBIC AND ANAEROBIC Blood Culture results may not be optimal due to an excessive volume of blood received in culture bottles   Culture   Final    NO GROWTH 5 DAYS Performed at [facility], [address] [place], [place] [address]    Report Status 04/23/2017 FINAL  Final  Wound or Superficial Culture     Status: None   Collection Time:  04/18/17  4:27 PM  Result Value Ref Range Status   Specimen Description   Final    AXILLA Performed at [facility], [address], [place], [place] [address]    Special Requests   Final    NONE Performed at [facility], [address] [place], [place] [address]    Gram Stain   Final    MODERATE WBC PRESENT, PREDOMINANTLY PMN FEW GRAM POSITIVE COCCI Performed at [facility], [address] [place], [place] [address]    Culture   Final    MODERATE METHICILLIN RESISTANT STAPHYLOCOCCUS AUREUS   Report Status 04/21/2017 FINAL  Final   Organism ID, Bacteria METHICILLIN RESISTANT STAPHYLOCOCCUS AUREUS  Final      Susceptibility   Methicillin resistant staphylococcus aureus - MIC*    CIPROFLOXACIN >=8 RESISTANT Resistant  ERYTHROMYCIN <=0.25 SENSITIVE Sensitive     GENTAMICIN <=0.5 SENSITIVE Sensitive     OXACILLIN RESISTANT Resistant     TETRACYCLINE <=1 SENSITIVE Sensitive     VANCOMYCIN 1 SENSITIVE Sensitive     TRIMETH/SULFA <=10 SENSITIVE Sensitive     CLINDAMYCIN <=0.25 SENSITIVE Sensitive     RIFAMPIN <=0.5 SENSITIVE Sensitive     Inducible Clindamycin NEGATIVE Sensitive     * MODERATE METHICILLIN RESISTANT STAPHYLOCOCCUS AUREUS  Aerobic/Anaerobic Culture (surgical/deep wound)     Status: None   Collection Time: 04/21/17 12:06 AM  Result Value Ref Range Status   Specimen Description ABSCESS RIGHT UPPER ARM  Final   Special Requests NONE  Final   Gram Stain   Final    ABUNDANT WBC PRESENT, PREDOMINANTLY PMN RARE GRAM POSITIVE COCCI IN PAIRS IN CLUSTERS    Culture   Final    RARE STAPHYLOCOCCUS AUREUS NO ANAEROBES ISOLATED Performed at [facility], [address] [place], [place] [address]    Report Status 04/26/2017 FINAL  Final   Organism ID, Bacteria STAPHYLOCOCCUS AUREUS  Final      Susceptibility   Staphylococcus aureus - MIC*    CIPROFLOXACIN >=8 RESISTANT Resistant     ERYTHROMYCIN <=0.25 SENSITIVE Sensitive      GENTAMICIN <=0.5 SENSITIVE Sensitive     OXACILLIN 0.5 SENSITIVE Sensitive     TETRACYCLINE <=1 SENSITIVE Sensitive     VANCOMYCIN <=0.5 SENSITIVE Sensitive     TRIMETH/SULFA <=10 SENSITIVE Sensitive     CLINDAMYCIN <=0.25 SENSITIVE Sensitive     RIFAMPIN <=0.5 SENSITIVE Sensitive     Inducible Clindamycin NEGATIVE Sensitive     * RARE STAPHYLOCOCCUS AUREUS    RADIOLOGY:  No results found.  Follow up with PCP in 1 week.  Management plans discussed with the patient, family and they are in agreement.  CODE STATUS:  Code Status History    Date Active Date Inactive Code Status Order ID Comments User Context   04/18/2017 1632 04/22/2017 1456 Full Code [id number]  Milagros Loll, MD ED      TOTAL TIME TAKING CARE OF THIS PATIENT ON DAY OF DISCHARGE: more than 30 minutes.   Molinda Bailiff Rachella Basden M.D on 05/03/2017 at 12:09 PM  Between 7am to 6pm - Pager - [phone]  After 6pm go to [email or web address] - password [email or web address]  [facility]  Office  [phone]  CC: Primary care physician; Evelene Croon, MD  Note: This dictation was prepared with Dragon dictation along with smaller phrase technology. Any transcriptional errors that result from this process are unintentional.

## 2018-05-11 IMAGING — US US EXTREM UP *R* LTD
1 series · 14 of 25 positions shown · non-contrast
Comparison: None

CLINICAL DATA: 46-year-old male with drainage from the right axilla

EXAM:
ULTRASOUND RIGHT UPPER EXTREMITY LIMITED
TECHNIQUE: Ultrasound examination of the upper extremity soft tissues was
performed in the area of clinical concern.

[Series 1: us extrem up *right* ltd · 0.07mm/px · 14 of 31 slices shown]
[im 1/31]
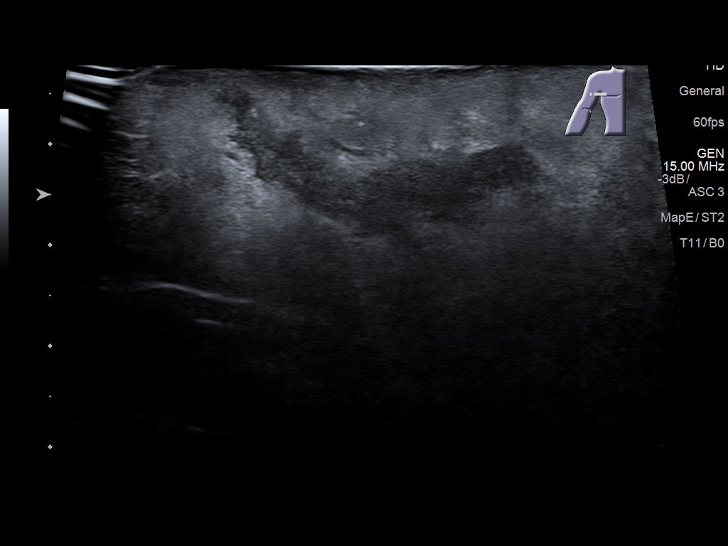
[im 3/31]
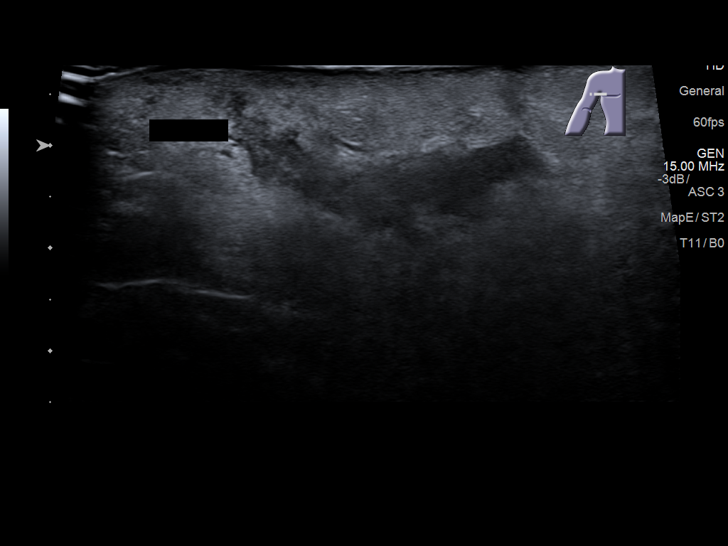
[im 6/31]
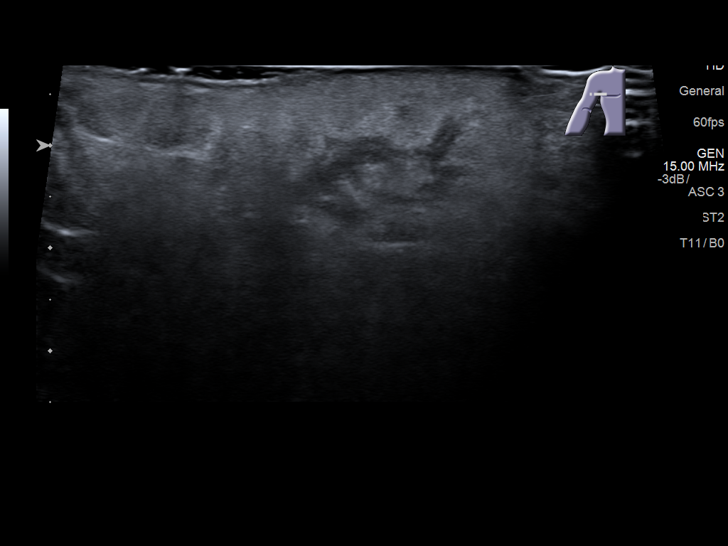
[im 8/31]
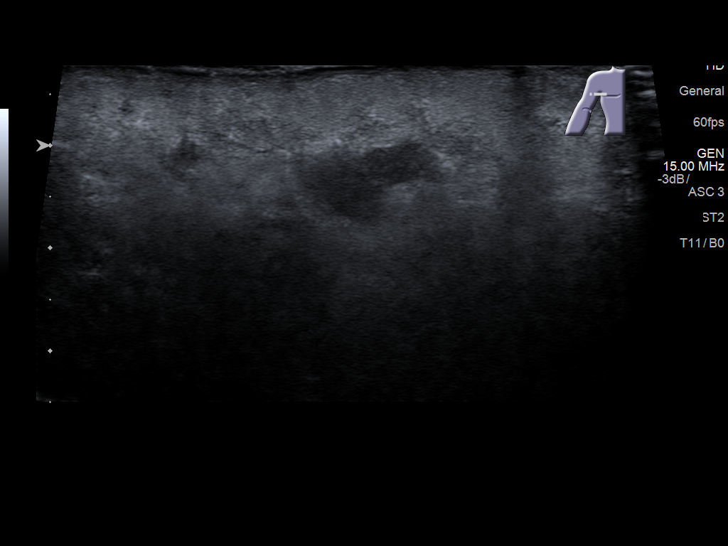
[im 11/31]
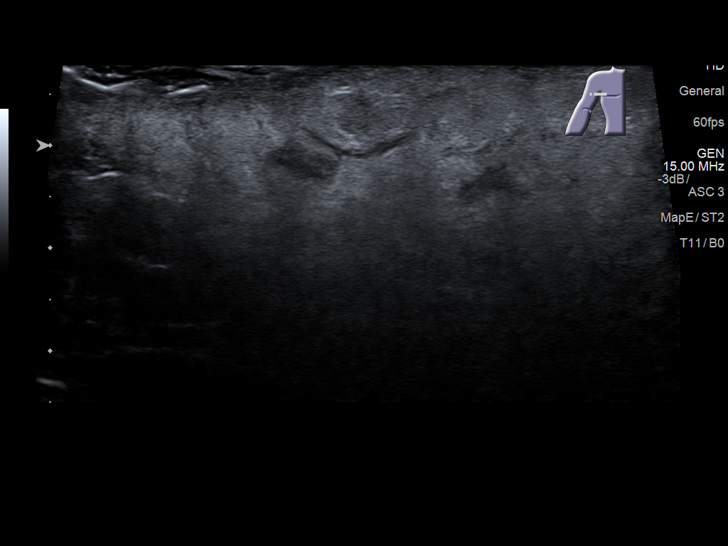
[im 12/31]
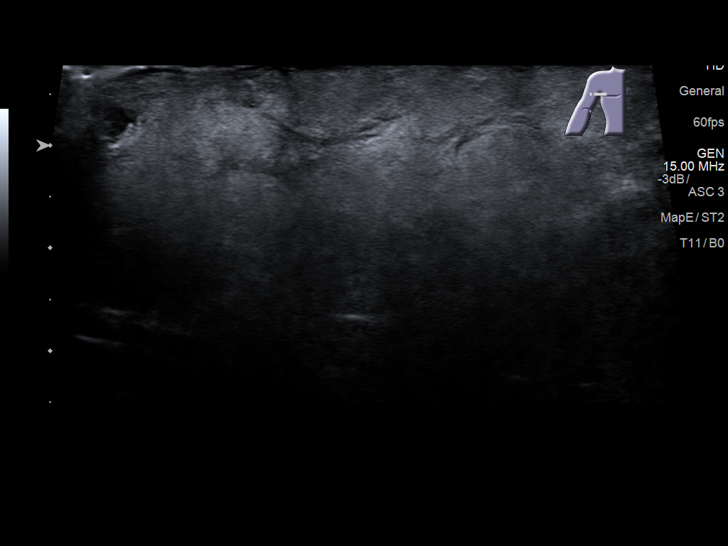
[im 14/31]
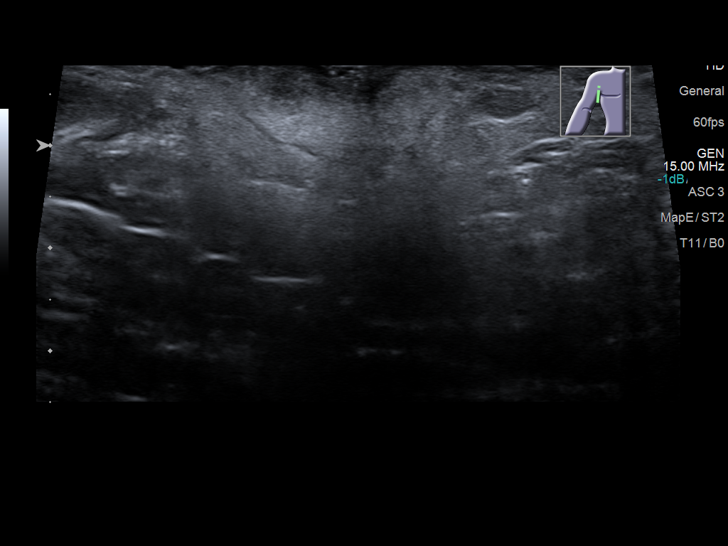
[im 17/31]
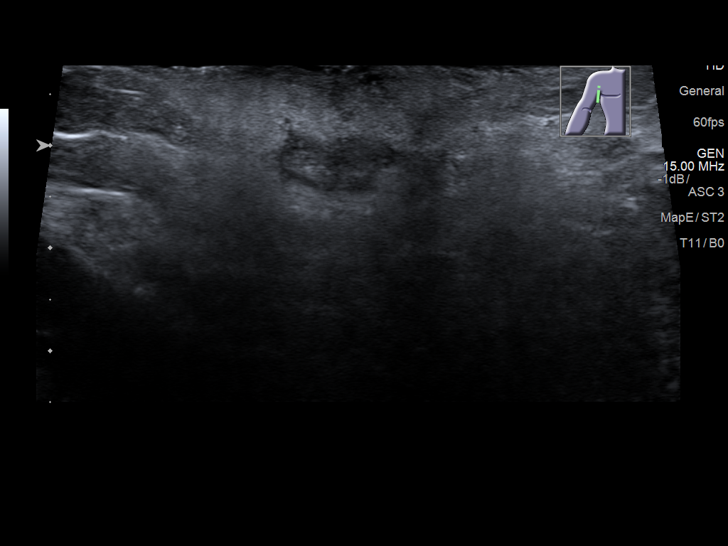
[im 19/31]
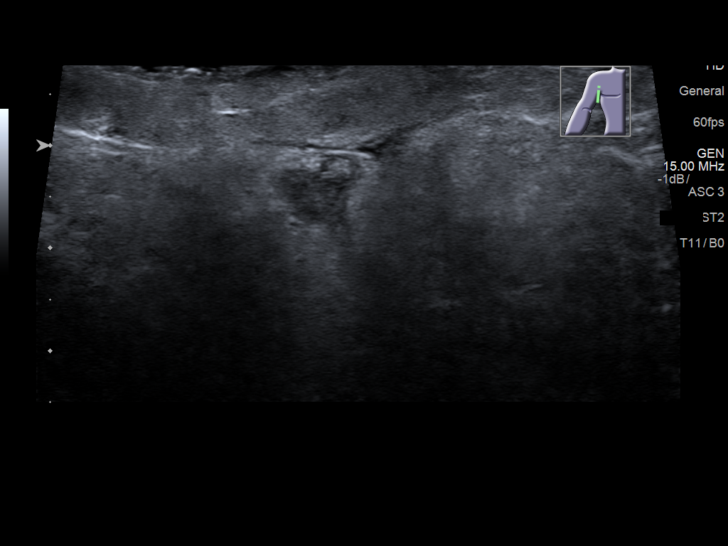
[im 21/31]
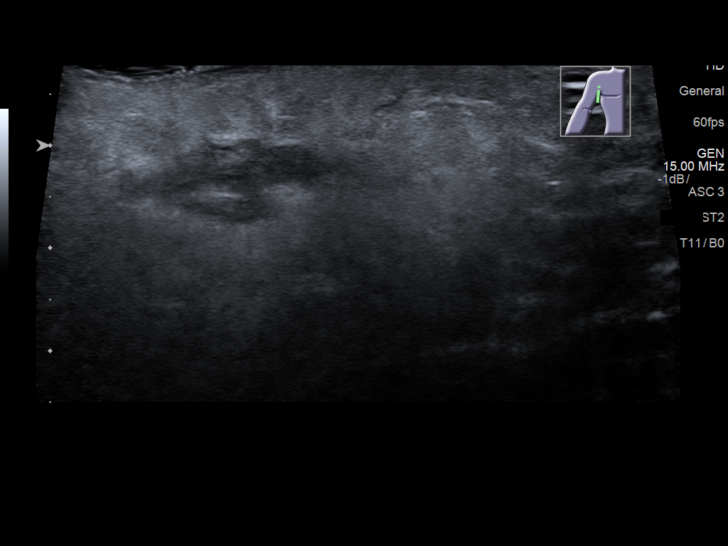
[im 23/31]
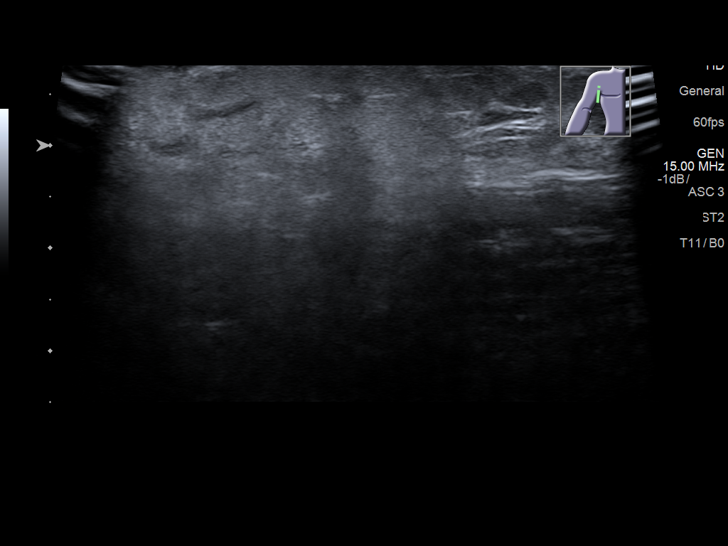
[im 26/31]
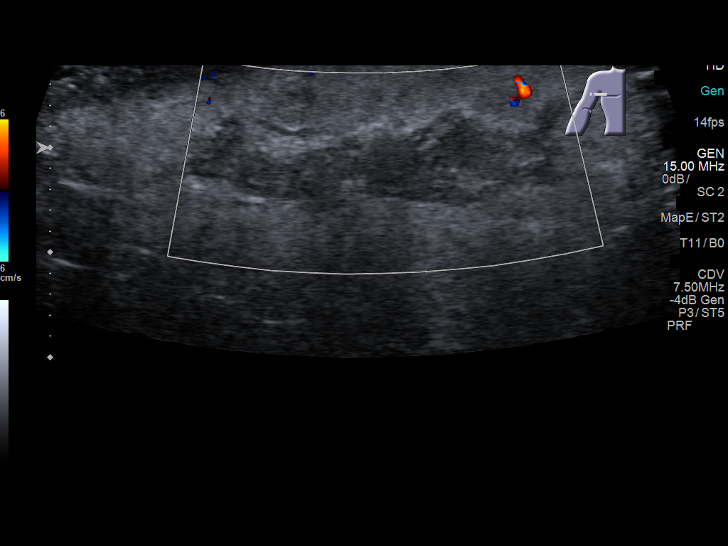
[im 28/31]
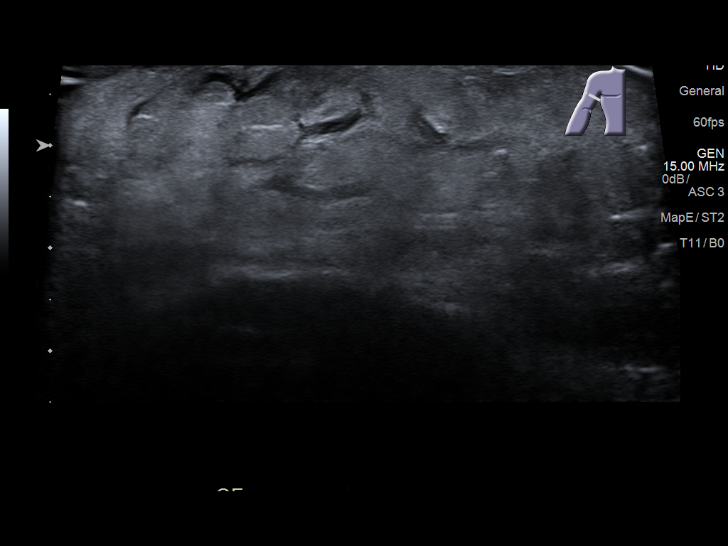
[im 31/31]
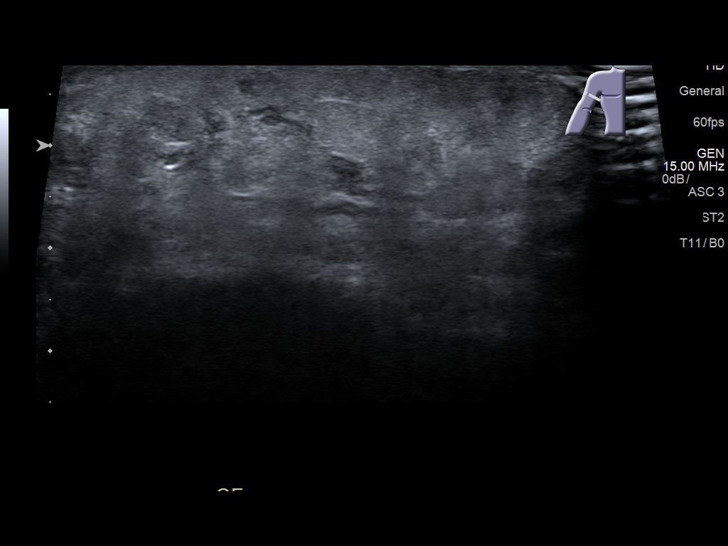

[14 of 25 positions shown; findings below may reference images not displayed]

FINDINGS: Directed duplex with grayscale and color imaging performed in the
region clinical concern.

There is a heterogeneously hypoechoic soft tissue focus in the right
axilla measuring 3 cm x 0.9 cm which appears on the ultrasound
survey to be continuous with the skin surface.

No fluid collection.  No significant internal flow.
IMPRESSION: Directed duplex in the region of clinical concern demonstrates focus
of heterogeneously echoic soft tissue, potentially phlegmon/edema
that is contiguous with the skin. No fluid identified.

## 2018-06-04 DEATH — deceased
